# Patient Record
Sex: Female | Born: 1935 | Race: Black or African American | Hispanic: No | Smoking: Former smoker
Health system: Southern US, Community
[De-identification: ages and names within clinical notes are randomized; demographics above are authoritative.]

## PROBLEM LIST (undated history)

## (undated) DIAGNOSIS — E119 Type 2 diabetes mellitus without complications: Secondary | ICD-10-CM

## (undated) DIAGNOSIS — E7800 Pure hypercholesterolemia, unspecified: Secondary | ICD-10-CM

## (undated) DIAGNOSIS — I10 Essential (primary) hypertension: Secondary | ICD-10-CM

## (undated) DIAGNOSIS — M1990 Unspecified osteoarthritis, unspecified site: Secondary | ICD-10-CM

## (undated) DIAGNOSIS — K469 Unspecified abdominal hernia without obstruction or gangrene: Secondary | ICD-10-CM

## (undated) DIAGNOSIS — I509 Heart failure, unspecified: Secondary | ICD-10-CM

## (undated) DIAGNOSIS — I4891 Unspecified atrial fibrillation: Secondary | ICD-10-CM

## (undated) DIAGNOSIS — I2510 Atherosclerotic heart disease of native coronary artery without angina pectoris: Secondary | ICD-10-CM

## (undated) DIAGNOSIS — K639 Disease of intestine, unspecified: Secondary | ICD-10-CM

## (undated) DIAGNOSIS — B0229 Other postherpetic nervous system involvement: Secondary | ICD-10-CM

## (undated) DIAGNOSIS — R920 Mammographic microcalcification found on diagnostic imaging of breast: Secondary | ICD-10-CM

## (undated) DIAGNOSIS — Z87891 Personal history of nicotine dependence: Secondary | ICD-10-CM

## (undated) DIAGNOSIS — Z1239 Encounter for other screening for malignant neoplasm of breast: Secondary | ICD-10-CM

## (undated) DIAGNOSIS — J449 Chronic obstructive pulmonary disease, unspecified: Secondary | ICD-10-CM

## (undated) DIAGNOSIS — J219 Acute bronchiolitis, unspecified: Secondary | ICD-10-CM

## (undated) DIAGNOSIS — E785 Hyperlipidemia, unspecified: Secondary | ICD-10-CM

## (undated) DIAGNOSIS — K2100 Gastro-esophageal reflux disease with esophagitis, without bleeding: Secondary | ICD-10-CM

## (undated) DIAGNOSIS — I272 Pulmonary hypertension, unspecified: Secondary | ICD-10-CM

## (undated) DIAGNOSIS — Z1211 Encounter for screening for malignant neoplasm of colon: Secondary | ICD-10-CM

## (undated) HISTORY — DX: Mammographic microcalcification found on diagnostic imaging of breast: R92.0

## (undated) HISTORY — DX: Disease of intestine, unspecified: K63.9

## (undated) HISTORY — DX: Heart failure, unspecified: I50.9

## (undated) HISTORY — DX: Type 2 diabetes mellitus without complications: E11.9

## (undated) HISTORY — DX: Personal history of nicotine dependence: Z87.891

## (undated) HISTORY — DX: Essential (primary) hypertension: I10

## (undated) HISTORY — DX: Chronic obstructive pulmonary disease, unspecified: J44.9

## (undated) HISTORY — PX: CARDIAC CATHETERIZATION: SHX172

## (undated) HISTORY — DX: Other postherpetic nervous system involvement: B02.29

## (undated) HISTORY — DX: Unspecified atrial fibrillation: I48.91

## (undated) HISTORY — DX: Atherosclerotic heart disease of native coronary artery without angina pectoris: I25.10

## (undated) HISTORY — DX: Gastro-esophageal reflux disease with esophagitis: K21.0

## (undated) HISTORY — DX: Unspecified abdominal hernia without obstruction or gangrene: K46.9

## (undated) HISTORY — DX: Unspecified osteoarthritis, unspecified site: M19.90

## (undated) HISTORY — DX: Hyperlipidemia, unspecified: E78.5

## (undated) HISTORY — DX: Gastro-esophageal reflux disease with esophagitis, without bleeding: K21.00

## (undated) HISTORY — PX: BREAST BIOPSY: SHX20

## (undated) HISTORY — DX: Acute bronchiolitis, unspecified: J21.9

## (undated) HISTORY — DX: Pure hypercholesterolemia, unspecified: E78.00

## (undated) HISTORY — DX: Encounter for other screening for malignant neoplasm of breast: Z12.39

## (undated) HISTORY — DX: Encounter for screening for malignant neoplasm of colon: Z12.11

---

## 1964-02-06 DIAGNOSIS — I10 Essential (primary) hypertension: Secondary | ICD-10-CM

## 1964-02-06 HISTORY — DX: Essential (primary) hypertension: I10

## 1966-02-05 DIAGNOSIS — K639 Disease of intestine, unspecified: Secondary | ICD-10-CM

## 1966-02-05 HISTORY — DX: Disease of intestine, unspecified: K63.9

## 1997-06-03 ENCOUNTER — Other Ambulatory Visit: Admission: RE | Admit: 1997-06-03 | Discharge: 1997-06-03 | Payer: Self-pay | Admitting: Obstetrics and Gynecology

## 1997-08-19 ENCOUNTER — Other Ambulatory Visit: Admission: RE | Admit: 1997-08-19 | Discharge: 1997-08-19 | Payer: Self-pay | Admitting: Obstetrics and Gynecology

## 1997-12-14 ENCOUNTER — Other Ambulatory Visit: Admission: RE | Admit: 1997-12-14 | Discharge: 1997-12-14 | Payer: Self-pay | Admitting: Obstetrics and Gynecology

## 1997-12-17 ENCOUNTER — Other Ambulatory Visit: Admission: RE | Admit: 1997-12-17 | Discharge: 1997-12-17 | Payer: Self-pay | Admitting: Obstetrics and Gynecology

## 1998-02-14 ENCOUNTER — Other Ambulatory Visit: Admission: RE | Admit: 1998-02-14 | Discharge: 1998-02-14 | Payer: Self-pay | Admitting: Obstetrics and Gynecology

## 1998-02-15 ENCOUNTER — Other Ambulatory Visit: Admission: RE | Admit: 1998-02-15 | Discharge: 1998-02-15 | Payer: Self-pay | Admitting: Obstetrics and Gynecology

## 1998-06-06 ENCOUNTER — Other Ambulatory Visit: Admission: RE | Admit: 1998-06-06 | Discharge: 1998-06-06 | Payer: Self-pay | Admitting: Obstetrics and Gynecology

## 1998-06-07 ENCOUNTER — Other Ambulatory Visit: Admission: RE | Admit: 1998-06-07 | Discharge: 1998-06-07 | Payer: Self-pay | Admitting: Obstetrics and Gynecology

## 1998-11-30 ENCOUNTER — Other Ambulatory Visit: Admission: RE | Admit: 1998-11-30 | Discharge: 1998-11-30 | Payer: Self-pay | Admitting: Obstetrics and Gynecology

## 1999-03-06 ENCOUNTER — Other Ambulatory Visit: Admission: RE | Admit: 1999-03-06 | Discharge: 1999-03-06 | Payer: Self-pay | Admitting: Obstetrics and Gynecology

## 1999-06-15 ENCOUNTER — Other Ambulatory Visit: Admission: RE | Admit: 1999-06-15 | Discharge: 1999-06-15 | Payer: Self-pay | Admitting: Obstetrics and Gynecology

## 2000-01-02 HISTORY — PX: CARDIAC CATHETERIZATION: SHX172

## 2000-06-17 ENCOUNTER — Other Ambulatory Visit: Admission: RE | Admit: 2000-06-17 | Discharge: 2000-06-17 | Payer: Self-pay | Admitting: Obstetrics and Gynecology

## 2001-06-23 ENCOUNTER — Other Ambulatory Visit: Admission: RE | Admit: 2001-06-23 | Discharge: 2001-06-23 | Payer: Self-pay | Admitting: Obstetrics and Gynecology

## 2003-02-06 HISTORY — PX: COLONOSCOPY: SHX174

## 2003-11-06 ENCOUNTER — Encounter: Payer: Self-pay | Admitting: Family Medicine

## 2003-12-07 ENCOUNTER — Encounter: Payer: Self-pay | Admitting: Family Medicine

## 2004-01-06 ENCOUNTER — Encounter: Payer: Self-pay | Admitting: Family Medicine

## 2004-01-27 ENCOUNTER — Ambulatory Visit: Payer: Self-pay | Admitting: Family Medicine

## 2004-02-06 ENCOUNTER — Encounter: Payer: Self-pay | Admitting: Family Medicine

## 2004-03-08 ENCOUNTER — Encounter: Payer: Self-pay | Admitting: Family Medicine

## 2004-04-05 ENCOUNTER — Encounter: Payer: Self-pay | Admitting: Family Medicine

## 2004-05-06 ENCOUNTER — Encounter: Payer: Self-pay | Admitting: Family Medicine

## 2004-06-05 ENCOUNTER — Encounter: Payer: Self-pay | Admitting: Family Medicine

## 2004-07-06 ENCOUNTER — Encounter: Payer: Self-pay | Admitting: Family Medicine

## 2004-08-02 ENCOUNTER — Ambulatory Visit: Payer: Self-pay | Admitting: Family Medicine

## 2004-08-04 ENCOUNTER — Ambulatory Visit: Payer: Self-pay | Admitting: Ophthalmology

## 2004-08-07 ENCOUNTER — Encounter: Payer: Self-pay | Admitting: Family Medicine

## 2004-08-14 ENCOUNTER — Ambulatory Visit: Payer: Self-pay | Admitting: Ophthalmology

## 2004-08-16 ENCOUNTER — Ambulatory Visit: Payer: Self-pay | Admitting: Family Medicine

## 2004-09-05 ENCOUNTER — Encounter: Payer: Self-pay | Admitting: Family Medicine

## 2004-09-15 ENCOUNTER — Ambulatory Visit: Payer: Self-pay | Admitting: Family Medicine

## 2004-09-21 ENCOUNTER — Ambulatory Visit: Payer: Self-pay | Admitting: Family Medicine

## 2004-10-06 ENCOUNTER — Encounter: Payer: Self-pay | Admitting: Family Medicine

## 2004-11-05 ENCOUNTER — Encounter: Payer: Self-pay | Admitting: Family Medicine

## 2004-12-06 ENCOUNTER — Encounter: Payer: Self-pay | Admitting: Family Medicine

## 2005-01-05 ENCOUNTER — Encounter: Payer: Self-pay | Admitting: Family Medicine

## 2005-02-06 ENCOUNTER — Encounter: Payer: Self-pay | Admitting: Family Medicine

## 2005-02-08 ENCOUNTER — Ambulatory Visit: Payer: Self-pay | Admitting: Family Medicine

## 2005-03-08 ENCOUNTER — Encounter: Payer: Self-pay | Admitting: Family Medicine

## 2005-04-05 ENCOUNTER — Encounter: Payer: Self-pay | Admitting: Family Medicine

## 2005-05-06 ENCOUNTER — Encounter: Payer: Self-pay | Admitting: Family Medicine

## 2005-06-05 ENCOUNTER — Encounter: Payer: Self-pay | Admitting: Family Medicine

## 2005-07-06 ENCOUNTER — Encounter: Payer: Self-pay | Admitting: Family Medicine

## 2005-08-07 ENCOUNTER — Encounter: Payer: Self-pay | Admitting: Family Medicine

## 2005-08-21 ENCOUNTER — Ambulatory Visit: Payer: Self-pay | Admitting: Family Medicine

## 2005-09-04 ENCOUNTER — Ambulatory Visit: Payer: Self-pay | Admitting: Family Medicine

## 2005-09-05 ENCOUNTER — Encounter: Payer: Self-pay | Admitting: Family Medicine

## 2005-10-06 ENCOUNTER — Encounter: Payer: Self-pay | Admitting: Family Medicine

## 2005-11-05 ENCOUNTER — Encounter: Payer: Self-pay | Admitting: Family Medicine

## 2005-12-06 ENCOUNTER — Encounter: Payer: Self-pay | Admitting: Family Medicine

## 2006-01-05 ENCOUNTER — Encounter: Payer: Self-pay | Admitting: Family Medicine

## 2006-02-05 ENCOUNTER — Encounter: Payer: Self-pay | Admitting: Family Medicine

## 2006-03-08 ENCOUNTER — Encounter: Payer: Self-pay | Admitting: Family Medicine

## 2006-03-13 ENCOUNTER — Ambulatory Visit: Payer: Self-pay | Admitting: Family Medicine

## 2006-04-09 ENCOUNTER — Encounter: Payer: Self-pay | Admitting: Family Medicine

## 2006-04-17 ENCOUNTER — Ambulatory Visit: Payer: Self-pay | Admitting: Family Medicine

## 2006-05-07 ENCOUNTER — Encounter: Payer: Self-pay | Admitting: Family Medicine

## 2006-06-06 ENCOUNTER — Encounter: Payer: Self-pay | Admitting: Family Medicine

## 2006-07-07 ENCOUNTER — Encounter: Payer: Self-pay | Admitting: Family Medicine

## 2006-08-06 ENCOUNTER — Encounter: Payer: Self-pay | Admitting: Family Medicine

## 2006-08-30 ENCOUNTER — Ambulatory Visit: Payer: Self-pay | Admitting: Family Medicine

## 2006-09-12 ENCOUNTER — Encounter: Payer: Self-pay | Admitting: Family Medicine

## 2006-09-17 ENCOUNTER — Ambulatory Visit: Payer: Self-pay | Admitting: Family Medicine

## 2006-10-07 ENCOUNTER — Encounter: Payer: Self-pay | Admitting: Family Medicine

## 2007-03-05 ENCOUNTER — Ambulatory Visit: Payer: Self-pay | Admitting: Family Medicine

## 2007-09-09 ENCOUNTER — Ambulatory Visit: Payer: Self-pay | Admitting: Family Medicine

## 2007-09-14 ENCOUNTER — Other Ambulatory Visit: Payer: Self-pay

## 2007-09-14 ENCOUNTER — Emergency Department: Payer: Self-pay | Admitting: Unknown Physician Specialty

## 2007-09-21 ENCOUNTER — Emergency Department: Payer: Self-pay | Admitting: Emergency Medicine

## 2007-09-25 ENCOUNTER — Ambulatory Visit: Payer: Self-pay | Admitting: Family Medicine

## 2007-09-26 ENCOUNTER — Ambulatory Visit: Payer: Self-pay | Admitting: Family Medicine

## 2007-10-07 ENCOUNTER — Ambulatory Visit: Payer: Self-pay | Admitting: Family Medicine

## 2007-10-29 ENCOUNTER — Ambulatory Visit: Payer: Self-pay | Admitting: Family Medicine

## 2007-11-06 ENCOUNTER — Ambulatory Visit: Payer: Self-pay | Admitting: Family Medicine

## 2007-12-07 ENCOUNTER — Ambulatory Visit: Payer: Self-pay | Admitting: Family Medicine

## 2008-01-06 ENCOUNTER — Ambulatory Visit: Payer: Self-pay | Admitting: Family Medicine

## 2008-03-18 ENCOUNTER — Ambulatory Visit: Payer: Self-pay | Admitting: Family Medicine

## 2008-05-06 ENCOUNTER — Ambulatory Visit: Payer: Self-pay | Admitting: Family Medicine

## 2008-09-28 ENCOUNTER — Ambulatory Visit: Payer: Self-pay | Admitting: Family Medicine

## 2009-02-05 DIAGNOSIS — I2510 Atherosclerotic heart disease of native coronary artery without angina pectoris: Secondary | ICD-10-CM

## 2009-02-05 DIAGNOSIS — K469 Unspecified abdominal hernia without obstruction or gangrene: Secondary | ICD-10-CM

## 2009-02-05 HISTORY — PX: HERNIA REPAIR: SHX51

## 2009-02-05 HISTORY — DX: Atherosclerotic heart disease of native coronary artery without angina pectoris: I25.10

## 2009-02-05 HISTORY — PX: CORONARY ANGIOPLASTY WITH STENT PLACEMENT: SHX49

## 2009-02-05 HISTORY — DX: Unspecified abdominal hernia without obstruction or gangrene: K46.9

## 2009-03-03 ENCOUNTER — Ambulatory Visit: Payer: Self-pay | Admitting: Family Medicine

## 2009-04-26 ENCOUNTER — Ambulatory Visit: Payer: Self-pay | Admitting: Specialist

## 2009-09-29 ENCOUNTER — Ambulatory Visit: Payer: Self-pay | Admitting: Family Medicine

## 2010-10-17 ENCOUNTER — Ambulatory Visit: Payer: Self-pay | Admitting: Family Medicine

## 2010-12-29 ENCOUNTER — Encounter: Payer: Self-pay | Admitting: Cardiovascular Disease

## 2010-12-29 ENCOUNTER — Inpatient Hospital Stay: Payer: Self-pay | Admitting: Internal Medicine

## 2010-12-30 ENCOUNTER — Encounter: Payer: Self-pay | Admitting: Cardiovascular Disease

## 2011-01-11 ENCOUNTER — Encounter: Payer: Self-pay | Admitting: Cardiovascular Disease

## 2011-01-11 ENCOUNTER — Ambulatory Visit: Payer: Self-pay | Admitting: Internal Medicine

## 2011-02-06 DIAGNOSIS — R920 Mammographic microcalcification found on diagnostic imaging of breast: Secondary | ICD-10-CM

## 2011-02-06 HISTORY — DX: Mammographic microcalcification found on diagnostic imaging of breast: R92.0

## 2011-02-06 LAB — HM PAP SMEAR: HM PAP: NORMAL

## 2011-02-09 ENCOUNTER — Ambulatory Visit: Payer: Self-pay | Admitting: Family Medicine

## 2011-03-09 ENCOUNTER — Ambulatory Visit: Payer: Self-pay | Admitting: Family Medicine

## 2011-03-27 ENCOUNTER — Encounter: Payer: Self-pay | Admitting: *Deleted

## 2011-03-27 ENCOUNTER — Ambulatory Visit (INDEPENDENT_AMBULATORY_CARE_PROVIDER_SITE_OTHER): Payer: Medicare Other | Admitting: Cardiovascular Disease

## 2011-03-27 ENCOUNTER — Ambulatory Visit: Payer: Self-pay | Admitting: Cardiovascular Disease

## 2011-03-27 ENCOUNTER — Encounter: Payer: Self-pay | Admitting: Cardiovascular Disease

## 2011-03-27 VITALS — BP 200/100 | HR 64 | Ht 60.0 in | Wt 103.0 lb

## 2011-03-27 DIAGNOSIS — I2510 Atherosclerotic heart disease of native coronary artery without angina pectoris: Secondary | ICD-10-CM

## 2011-03-27 DIAGNOSIS — J40 Bronchitis, not specified as acute or chronic: Secondary | ICD-10-CM | POA: Insufficient documentation

## 2011-03-27 DIAGNOSIS — I10 Essential (primary) hypertension: Secondary | ICD-10-CM

## 2011-03-27 DIAGNOSIS — I5020 Unspecified systolic (congestive) heart failure: Secondary | ICD-10-CM

## 2011-03-27 DIAGNOSIS — J449 Chronic obstructive pulmonary disease, unspecified: Secondary | ICD-10-CM

## 2011-03-27 DIAGNOSIS — Z87891 Personal history of nicotine dependence: Secondary | ICD-10-CM

## 2011-03-27 MED ORDER — LOSARTAN POTASSIUM 100 MG PO TABS: 100.0000 mg | ORAL_TABLET | Freq: Every day | ORAL | Status: DC

## 2011-03-27 MED ORDER — AMLODIPINE BESYLATE 10 MG PO TABS: 10.0000 mg | ORAL_TABLET | Freq: Every day | ORAL | Status: DC

## 2011-03-27 NOTE — Assessment & Plan Note (Signed)
She has long history of smoking, likely mild to moderate COPD, chronic cough from smoking.

## 2011-03-27 NOTE — Progress Notes (Signed)
Patient ID: Allison Francis, female    DOB: 1935-02-10, 76 y.o.   MRN: [id number]  HPI Comments: Ms. Marmo is a very pleasant 76 year old woman with a long history of smoking from age 40 q.h.s. The stopped 15 years ago, Coronary artery disease with stenting in October 2011, diabetes history of hypertension who presented to [facility] at the end of November with acute bronchitis, admitted to the hospital for further management. Echocardiogram showed moderately reduced ejection fraction estimated at 25-35%, moderately dilated left atrium, moderate TR, right ventricular systolic pressure mildly elevated.  She had a pharmacologic Myoview that showed no significant ischemia. Interestingly ejection fraction was normal estimated greater than 55%  Her Lasix and amlodipine were discontinued while in the hospital. She has not been checking her numbers at home. She is surprised that her blood pressure numbers are elevated today. She otherwise feels well and reports her bronchitis is improving. She does have a chronic cough She is very concerned about a diagnosis of COPD and CHF. She denies any significant abdominal bloating, no lower extremity edema, no PND or orthopnea  Blood work in the hospital shows total cholesterol 156, LDL 92, HDL 52  EKG shows normal sinus rhythm with rate 57 beats per minute with nonspecific ST abnormality in leads V5, V6, inferior leads   Outpatient Encounter Prescriptions as of 03/27/2011  Medication Sig Dispense Refill  . allopurinol (ZYLOPRIM) 100 MG tablet Take 100 mg by mouth daily.      Marland Kitchen aspirin EC 81 MG tablet Take 81 mg by mouth daily.      . metoprolol succinate (TOPROL-XL) 50 MG 24 hr tablet Take 50 mg by mouth daily. Take with or immediately following a meal.      . omeprazole (PRILOSEC) 40 MG capsule Take 40 mg by mouth daily.      Marland Kitchen  losartan (COZAAR) 50 MG tablet Take 50 mg by mouth daily.        Review of Systems  Constitutional: Negative.   HENT: Negative.     Eyes: Negative.   Respiratory: Positive for cough.   Cardiovascular: Negative.   Gastrointestinal: Negative.   Musculoskeletal: Negative.   Skin: Negative.   Neurological: Negative.   Hematological: Negative.   Psychiatric/Behavioral: Negative.   All other systems reviewed and are negative.    BP 200/100  Pulse 64  Ht 5' (1.524 m)  Wt 103 lb (46.72 kg)  BMI 20.12 kg/m2  Physical Exam  Nursing note and vitals reviewed. Constitutional: She is oriented to person, place, and time. She appears well-developed and well-nourished.  HENT:  Head: Normocephalic.  Nose: Nose normal.  Mouth/Throat: Oropharynx is clear and moist.  Eyes: Conjunctivae are normal. Pupils are equal, round, and reactive to light.  Neck: Normal range of motion. Neck supple. No JVD present.  Cardiovascular: Normal rate, regular rhythm, S1 normal, S2 normal, normal heart sounds and intact distal pulses.  Exam reveals no gallop and no friction rub.   No murmur heard. Pulmonary/Chest: Effort normal and breath sounds normal. No respiratory distress. She has no wheezes. She has no rales. She exhibits no tenderness.  Abdominal: Soft. Bowel sounds are normal. She exhibits no distension. There is no tenderness.  Musculoskeletal: Normal range of motion. She exhibits no edema and no tenderness.  Lymphadenopathy:    She has no cervical adenopathy.  Neurological: She is alert and oriented to person, place, and time. Coordination normal.  Skin: Skin is warm and dry. No rash noted. No erythema.  Psychiatric: She  has a normal mood and affect. Her behavior is normal. Judgment and thought content normal.         Assessment and Plan

## 2011-03-27 NOTE — Patient Instructions (Addendum)
  Please start amlodipine 10 mg a day (two of the the 5 mg tabs you have at home)  Increase the losartan to 100 mg daily (two of the 50 mg losartan pills) starting tomorrow if your blood pressure stays high. Goal blood pressure is less than 140 on the top.   Please call us if you have new issues that need to be addressed before your next appt.  Your physician wants you to follow-up in: 1 months.  You will receive a reminder letter in the mail two months in advance. If you don't receive a letter, please call our office to schedule the follow-up appointment.

## 2011-03-27 NOTE — Assessment & Plan Note (Signed)
Echocardiogram in November 2012 showing ejection fraction 25-35%. Interestingly stress test showed normal LV function. I will review the previous echocardiogram. She appears relatively asymptomatic at this time. At some point, repeat echocardiogram could be done to reassess her LV function. Uncertain if the systolic dysfunction was secondary to concurrent upper respiratory tract infection/bronchitis.  Unable to exclude a hypertensive cardiomyopathy.

## 2011-03-27 NOTE — Assessment & Plan Note (Signed)
History of coronary artery disease and stenting. We will try to obtain the records of this for our system. Currently with no symptoms of chest pain. Stress test showing no significant ischemia.

## 2011-03-27 NOTE — Assessment & Plan Note (Addendum)
Blood pressure is very elevated today. We have suggested she restart her amlodipine 10 mg daily. If her blood pressure continues to be elevated over the next several days, she could increase her losartan to 100 mg daily. Have asked her to closely monitor her blood pressure at home and call our office with some numbers. We will have her followup in one month for her blood pressure management.

## 2011-09-10 ENCOUNTER — Ambulatory Visit: Payer: Self-pay | Admitting: Unknown Physician Specialty

## 2011-09-10 LAB — CREATININE, SERUM
EGFR (African American): 44 — ABNORMAL LOW
EGFR (Non-African Amer.): 38 — ABNORMAL LOW

## 2011-10-18 ENCOUNTER — Ambulatory Visit: Payer: Self-pay | Admitting: Family Medicine

## 2011-10-24 ENCOUNTER — Ambulatory Visit: Payer: Self-pay | Admitting: Family Medicine

## 2012-02-06 DIAGNOSIS — J219 Acute bronchiolitis, unspecified: Secondary | ICD-10-CM

## 2012-02-06 HISTORY — DX: Acute bronchiolitis, unspecified: J21.9

## 2012-03-06 ENCOUNTER — Encounter: Payer: Self-pay | Admitting: *Deleted

## 2012-03-09 LAB — COMPREHENSIVE METABOLIC PANEL
Albumin: 3.3 g/dL — ABNORMAL LOW (ref 3.4–5.0)
Alkaline Phosphatase: 141 U/L — ABNORMAL HIGH (ref 50–136)
Bilirubin,Total: 0.5 mg/dL (ref 0.2–1.0)
Bilirubin,Total: 0.7 mg/dL (ref 0.2–1.0)
Calcium, Total: 8 mg/dL — ABNORMAL LOW (ref 8.5–10.1)
Calcium, Total: 8.5 mg/dL (ref 8.5–10.1)
Chloride: 103 mmol/L (ref 98–107)
Co2: 26 mmol/L (ref 21–32)
Co2: 27 mmol/L (ref 21–32)
EGFR (African American): >60
EGFR (African American): >60
EGFR (Non-African Amer.): >60
Glucose: 301 mg/dL — ABNORMAL HIGH (ref 65–99)
Potassium: 4.2 mmol/L (ref 3.5–5.1)
SGOT(AST): 63 U/L — ABNORMAL HIGH (ref 15–37)
SGOT(AST): 50 U/L — ABNORMAL HIGH (ref 15–37)
SGPT (ALT): 103 U/L — ABNORMAL HIGH (ref 12–78)
SGPT (ALT): 101 U/L — ABNORMAL HIGH (ref 12–78)
Sodium: 137 mmol/L (ref 136–145)
Sodium: 140 mmol/L (ref 136–145)

## 2012-03-09 LAB — URINALYSIS, COMPLETE
Bilirubin,UR: NEGATIVE
Glucose,UR: 150 mg/dL (ref 0–75)
Leukocyte Esterase: NEGATIVE
Ph: 5 (ref 4.5–8.0)
Protein: NEGATIVE
Specific Gravity: 1.012 (ref 1.003–1.030)

## 2012-03-09 LAB — CBC WITH DIFFERENTIAL/PLATELET
Basophil #: 0 10*3/uL (ref 0.0–0.1)
Eosinophil #: 0 10*3/uL (ref 0.0–0.7)
Eosinophil %: 0 %
HCT: 39 % (ref 35.0–47.0)
HGB: 12.4 g/dL (ref 12.0–16.0)
Lymphocyte #: 0.4 10*3/uL — ABNORMAL LOW (ref 1.0–3.6)
Lymphocyte %: 2.9 %
MCH: 27.3 pg (ref 26.0–34.0)
MCHC: 31.8 g/dL — ABNORMAL LOW (ref 32.0–36.0)
Monocyte #: 0.1 x10 3/mm — ABNORMAL LOW (ref 0.2–0.9)
Monocyte %: 1 %
Neutrophil #: 11.6 10*3/uL — ABNORMAL HIGH (ref 1.4–6.5)
RBC: 4.56 10*6/uL (ref 3.80–5.20)
RDW: 15.5 % — ABNORMAL HIGH (ref 11.5–14.5)
WBC: 12.1 10*3/uL — ABNORMAL HIGH (ref 3.6–11.0)

## 2012-03-09 LAB — CBC
HCT: 36.8 % (ref 35.0–47.0)
HGB: 11.8 g/dL — ABNORMAL LOW (ref 12.0–16.0)
MCH: 27.4 pg (ref 26.0–34.0)
MCHC: 32.2 g/dL (ref 32.0–36.0)
MCV: 85 fL (ref 80–100)
RBC: 4.32 10*6/uL (ref 3.80–5.20)
WBC: 16.7 10*3/uL — ABNORMAL HIGH (ref 3.6–11.0)

## 2012-03-09 LAB — CK TOTAL AND CKMB (NOT AT ARMC)
CK, Total: 32 U/L (ref 21–215)
CK, Total: 34 U/L (ref 21–215)
CK-MB: 1.1 ng/mL (ref 0.5–3.6)
CK-MB: 2.3 ng/mL (ref 0.5–3.6)

## 2012-03-09 LAB — TROPONIN I: Troponin-I: 0.1 ng/mL — ABNORMAL HIGH

## 2012-03-09 LAB — PRO B NATRIURETIC PEPTIDE: B-Type Natriuretic Peptide: 5687 pg/mL — ABNORMAL HIGH (ref 0–125)

## 2012-03-10 ENCOUNTER — Inpatient Hospital Stay: Payer: Self-pay | Admitting: Specialist

## 2012-03-14 LAB — CULTURE, BLOOD (SINGLE)

## 2012-03-18 ENCOUNTER — Ambulatory Visit: Payer: Self-pay | Admitting: Unknown Physician Specialty

## 2012-03-18 LAB — CREATININE, SERUM
Creatinine: 1.16 mg/dL (ref 0.60–1.30)
EGFR (African American): 54 — ABNORMAL LOW
EGFR (Non-African Amer.): 46 — ABNORMAL LOW

## 2012-04-15 ENCOUNTER — Encounter: Payer: Self-pay | Admitting: *Deleted

## 2012-04-17 ENCOUNTER — Ambulatory Visit: Payer: Self-pay | Admitting: General Surgery

## 2012-04-24 ENCOUNTER — Ambulatory Visit (INDEPENDENT_AMBULATORY_CARE_PROVIDER_SITE_OTHER): Payer: Medicare Other | Admitting: General Surgery

## 2012-04-24 ENCOUNTER — Other Ambulatory Visit: Payer: Self-pay

## 2012-04-24 ENCOUNTER — Encounter: Payer: Self-pay | Admitting: General Surgery

## 2012-04-24 VITALS — BP 98/62 | HR 76 | Resp 14 | Ht 60.0 in | Wt 100.0 lb

## 2012-04-24 DIAGNOSIS — R920 Mammographic microcalcification found on diagnostic imaging of breast: Secondary | ICD-10-CM

## 2012-04-24 DIAGNOSIS — N63 Unspecified lump in unspecified breast: Secondary | ICD-10-CM

## 2012-04-24 NOTE — Patient Instructions (Addendum)
Patient has been scheduled for a right breast stereotactic biopsy at [facility] for 05-19-12 at 4 pm (arrive 3:30 pm). She is aware of date, time, and instructions.

## 2012-04-24 NOTE — Progress Notes (Signed)
Patient ID: Allison Francis, female   DOB: February 25, 1935, 77 y.o.   MRN: [id number]  Chief Complaint  Patient presents with  . Follow-up    mammo    HPI Allison Francis is a 77 y.o. female following up from a mammogram done at [facility] 3/13/144. Patient reports no new breast problems. HPI  Past Medical History  Diagnosis Date  . Type II or unspecified type diabetes mellitus without mention of complication, not stated as uncontrolled   . Chronic airway obstruction, not elsewhere classified   . Congestive heart failure, unspecified   . Pure hypercholesterolemia   . Essential hypertension, benign   . Gouty arthropathy   . Coronary atherosclerosis of unspecified type of vessel, native or graft 2011  . Arthritis   . Hernia 2011  . Hypertension 1966  . Personal history of tobacco use, presenting hazards to health   . Breast screening, unspecified   . Mammographic microcalcification 2013  . Special screening for malignant neoplasms, colon   . Bowel trouble 1968  . Bronchiolitis 2014    Past Surgical History  Procedure Laterality Date  . Cardiac catheterization  01-02-00    cardiac stent, RCA  . Hernia repair  2011  . Coronary angioplasty with stent placement  2011  . Colonoscopy  2005    Family History  Problem Relation Age of Onset  . Multiple myeloma Mother   . Heart attack Mother   . Breast cancer Mother   . Hypertension      fx  . Diabetes Mother     Social History History  Substance Use Topics  . Smoking status: Former Smoker -- 1.00 packs/day for 35 years    Types: Cigarettes    Quit date: 02/06/1996  . Smokeless tobacco: Never Used  . Alcohol Use: 3.0 oz/week    6 drink(s) per week    Allergies  Allergen Reactions  . Metformin Hcl Diarrhea  . Penicillins Hives    Current Outpatient Prescriptions  Medication Sig Dispense Refill  . allopurinol (ZYLOPRIM) 100 MG tablet Take 100 mg by mouth daily.      Marland Kitchen amLODipine (NORVASC) 10 MG tablet Take 1 tablet (10 mg total)  by mouth daily.  30 tablet  11  . amLODipine (NORVASC) 10 MG tablet Take 5 mg by mouth daily.      Marland Kitchen aspirin EC 81 MG tablet Take 81 mg by mouth daily.      . Fluticasone-Salmeterol (ADVAIR DISKUS) 250-50 MCG/DOSE AEPB Inhale 1 puff into the lungs every 12 (twelve) hours.      . lidocaine (LIDODERM) 5 % Apply 1 patch topically daily. Remove & Discard patch within 12 hours or as directed by MD      . losartan (COZAAR) 100 MG tablet Take 1 tablet (100 mg total) by mouth daily.  90 tablet  4  . metoprolol succinate (TOPROL-XL) 50 MG 24 hr tablet Take 50 mg by mouth daily. Take with or immediately following a meal.      . omeprazole (PRILOSEC) 40 MG capsule Take 40 mg by mouth daily.      . propafenone (RYTHMOL SR) 225 MG 12 hr capsule        No current facility-administered medications for this visit.    Review of Systems Review of Systems  Constitutional: Negative.   Respiratory: Negative.   Cardiovascular: Negative.     Blood pressure 98/62, pulse 76, resp. rate 14, height 5' (1.524 m), weight 100 lb (45.36 kg), last menstrual period 02/05/1989.  Physical Exam Physical Exam  Nursing note and vitals reviewed. Constitutional: She appears well-developed and well-nourished.  Neck: Normal range of motion.  Cardiovascular: Normal rate, regular rhythm and normal heart sounds.   Pulmonary/Chest: Breath sounds normal. Right breast exhibits no inverted nipple, no mass, no nipple discharge, no skin change and no tenderness. Left breast exhibits no inverted nipple, no mass, no nipple discharge, no skin change and no tenderness.  Lymphadenopathy:    She has axillary adenopathy.  There is a palpable lymph node in the lower aspect of the right axilla.  Data Reviewed Ultrasound examination of the right axilla confirms a mildly enlarged 0.5 x 1.06 x 1.18 lymph node with normal echo architecture. This likely corresponds a lesion identified on a recent mammogram. The mammogram dated April 17, 2012  showed a subtle cluster of calcifications that are more prominent than on her last exam.  BIRAD 4. Assessment    Microcalcifications of the right breast.  Recent pneumonia requiring hospitalization with mild, persistent cough     Plan    Considering the increasing prominence of the microcalcifications excision as been recommended once her pulmonary symptoms have abated. Arrangements are in place for a right breast stereotactic biopsy on May 19, 2012.        Diamond Nickel 04/24/2012, 3:20 PM

## 2012-05-01 ENCOUNTER — Ambulatory Visit: Payer: Self-pay | Admitting: Unknown Physician Specialty

## 2012-05-19 ENCOUNTER — Ambulatory Visit: Payer: Medicare Other | Admitting: General Surgery

## 2012-05-19 ENCOUNTER — Ambulatory Visit: Payer: Self-pay | Admitting: General Surgery

## 2012-05-19 DIAGNOSIS — R920 Mammographic microcalcification found on diagnostic imaging of breast: Secondary | ICD-10-CM

## 2012-05-20 ENCOUNTER — Telehealth: Payer: Self-pay | Admitting: *Deleted

## 2012-05-20 LAB — PATHOLOGY REPORT

## 2012-05-20 NOTE — Telephone Encounter (Signed)
Notified patient of most recent breast biopsy benign per Dr. Lemar Livings, patient pleased.  Reminded of nurse appointment.

## 2012-05-21 ENCOUNTER — Encounter: Payer: Self-pay | Admitting: General Surgery

## 2012-05-22 ENCOUNTER — Ambulatory Visit: Payer: Self-pay | Admitting: Unknown Physician Specialty

## 2012-05-27 ENCOUNTER — Ambulatory Visit (INDEPENDENT_AMBULATORY_CARE_PROVIDER_SITE_OTHER): Payer: Medicare Other | Admitting: *Deleted

## 2012-05-27 DIAGNOSIS — N63 Unspecified lump in unspecified breast: Secondary | ICD-10-CM

## 2012-05-27 NOTE — Patient Instructions (Addendum)
Resume activities as tolerated. The patient is aware to call back for any questions or concerns. 

## 2012-05-27 NOTE — Progress Notes (Signed)
Patient here today for follow up post right breast biopsy.  Dressing off, steristrip in place and aware it may come off in one week.  Minimal external bruising noted, however she does have a "knot" that if felt. Aware she is to use a heating pad for comfort twice a day.  Aware of pathology.   Follow up in 6 months

## 2012-05-28 NOTE — Progress Notes (Signed)
Quick Note:  The right breast biopsy completed for microcalcification showed ductal hyperplasia, sclerosing adenosis and microcalcifications as well as an apical cyst and benign encapsulated lymphoid tissue. No evidence of atypia or malignancy.  The patient has been notified of the results.  Arrangements were made for a post biopsy mammogram in 6 months with office visit follow.  CC: PCP ______

## 2012-06-12 ENCOUNTER — Ambulatory Visit: Payer: Self-pay | Admitting: Family Medicine

## 2012-06-12 LAB — HM DEXA SCAN

## 2012-06-17 ENCOUNTER — Encounter: Payer: Self-pay | Admitting: General Surgery

## 2012-07-10 ENCOUNTER — Encounter: Payer: Self-pay | Admitting: Specialist

## 2012-08-05 ENCOUNTER — Encounter: Payer: Self-pay | Admitting: Specialist

## 2012-08-13 DIAGNOSIS — I429 Cardiomyopathy, unspecified: Secondary | ICD-10-CM | POA: Insufficient documentation

## 2012-08-13 DIAGNOSIS — M109 Gout, unspecified: Secondary | ICD-10-CM | POA: Insufficient documentation

## 2012-08-13 DIAGNOSIS — I4891 Unspecified atrial fibrillation: Secondary | ICD-10-CM | POA: Insufficient documentation

## 2012-08-14 DIAGNOSIS — K219 Gastro-esophageal reflux disease without esophagitis: Secondary | ICD-10-CM | POA: Insufficient documentation

## 2012-08-14 DIAGNOSIS — E119 Type 2 diabetes mellitus without complications: Secondary | ICD-10-CM | POA: Insufficient documentation

## 2012-09-05 ENCOUNTER — Encounter: Payer: Self-pay | Admitting: Specialist

## 2012-10-21 ENCOUNTER — Ambulatory Visit: Payer: Self-pay | Admitting: General Surgery

## 2012-10-23 ENCOUNTER — Encounter: Payer: Self-pay | Admitting: General Surgery

## 2012-10-29 ENCOUNTER — Ambulatory Visit: Payer: Medicare Other | Admitting: General Surgery

## 2012-11-04 ENCOUNTER — Encounter: Payer: Self-pay | Admitting: General Surgery

## 2012-11-04 ENCOUNTER — Ambulatory Visit (INDEPENDENT_AMBULATORY_CARE_PROVIDER_SITE_OTHER): Payer: Medicare Other | Admitting: General Surgery

## 2012-11-04 VITALS — BP 140/70 | HR 78 | Resp 16 | Ht 60.0 in | Wt 102.0 lb

## 2012-11-04 DIAGNOSIS — N63 Unspecified lump in unspecified breast: Secondary | ICD-10-CM

## 2012-11-04 NOTE — Patient Instructions (Signed)
Patient to return as needed. 

## 2012-11-04 NOTE — Progress Notes (Signed)
Patient ID: Allison Francis, female   DOB: 1935/10/25, 77 y.o.   MRN: [id number]  Chief Complaint  Patient presents with  . Follow-up    mammogram    HPI Allison Francis is a 77 y.o. female who presents for a breast evaluation. The most recent mammogram was done on 10/21/12 with a birad category 2. Patient does perform regular self breast checks and gets regular mammograms done.  The patient denies any new problems with the breasts at this time.    HPI  Past Medical History  Diagnosis Date  . Type II or unspecified type diabetes mellitus without mention of complication, not stated as uncontrolled   . Chronic airway obstruction, not elsewhere classified   . Congestive heart failure, unspecified   . Pure hypercholesterolemia   . Essential hypertension, benign   . Gouty arthropathy   . Coronary atherosclerosis of unspecified type of vessel, native or graft 2011  . Arthritis   . Hernia 2011  . Hypertension 1966  . Personal history of tobacco use, presenting hazards to health   . Breast screening, unspecified   . Mammographic microcalcification 2013  . Special screening for malignant neoplasms, colon   . Bowel trouble 1968  . Bronchiolitis 2014    Past Surgical History  Procedure Laterality Date  . Cardiac catheterization  01-02-00    cardiac stent, RCA  . Hernia repair  2011  . Coronary angioplasty with stent placement  2011  . Colonoscopy  2005    Family History  Problem Relation Age of Onset  . Multiple myeloma Mother   . Heart attack Mother   . Breast cancer Mother   . Hypertension      fx  . Diabetes Mother     Social History History  Substance Use Topics  . Smoking status: Former Smoker -- 1.00 packs/day for 35 years    Types: Cigarettes    Quit date: 02/06/1996  . Smokeless tobacco: Never Used  . Alcohol Use: 3.0 oz/week    6 drink(s) per week    Allergies  Allergen Reactions  . Metformin Hcl Diarrhea  . Penicillins Hives    Current Outpatient  Prescriptions  Medication Sig Dispense Refill  . allopurinol (ZYLOPRIM) 100 MG tablet Take 100 mg by mouth daily.      Marland Kitchen amLODipine (NORVASC) 5 MG tablet Take 5 mg by mouth daily.      Marland Kitchen aspirin EC 81 MG tablet Take 81 mg by mouth daily.      . Fluticasone-Salmeterol (ADVAIR DISKUS) 250-50 MCG/DOSE AEPB Inhale 1 puff into the lungs every 12 (twelve) hours.      . lidocaine (LIDODERM) 5 % Apply 1 patch topically daily. Remove & Discard patch within 12 hours or as directed by MD      . losartan (COZAAR) 100 MG tablet Take 1 tablet (100 mg total) by mouth daily.  90 tablet  4  . metoprolol succinate (TOPROL-XL) 50 MG 24 hr tablet Take 50 mg by mouth daily. Take with or immediately following a meal.      . omeprazole (PRILOSEC) 40 MG capsule Take 40 mg by mouth daily.      . propafenone (RYTHMOL SR) 225 MG 12 hr capsule        No current facility-administered medications for this visit.    Review of Systems Review of Systems  Constitutional: Negative.   Respiratory: Negative.   Cardiovascular: Negative.     Blood pressure 140/70, pulse 78, resp. rate 16, height 5' (1.524  m), weight 102 lb (46.267 kg), last menstrual period 02/05/1989.  Physical Exam Physical Exam  Constitutional: She is oriented to person, place, and time. She appears well-developed and well-nourished.  Neck: No thyromegaly present.  Cardiovascular: Normal rate, regular rhythm and normal heart sounds.   No murmur heard. Pulmonary/Chest: Effort normal and breath sounds normal. Right breast exhibits no inverted nipple, no mass, no nipple discharge, no skin change and no tenderness. Left breast exhibits no inverted nipple, no mass, no nipple discharge, no skin change and no tenderness.  Lymphadenopathy:    She has no cervical adenopathy.    She has no axillary adenopathy.  Neurological: She is alert and oriented to person, place, and time.  Skin: Skin is warm and dry.    Data Reviewed Bilateral mammograms dated  10/21/2012 were reviewed. Left breast is unremarkable. Small residual postsurgical hematoma in the upper outer aspect of the right breast with associated stereotactic clip. Reduction a number of clustered microcalcifications. BI-RAD-2.   The right breast biopsy completed April 2014 for microcalcification showed ductal hyperplasia, sclerosing adenosis and microcalcifications as well as an apical cyst and benign encapsulated lymphoid tissue. No evidence of atypia or malignancy.   Assessment    Benign breast exam.    Plan    The patient will resume annual screening mammograms with her primary care provider in fall 2015. She was encouraged to call should she develop any new breast issues.       Earline Mayotte 11/05/2012, 7:07 PM

## 2012-11-05 ENCOUNTER — Encounter: Payer: Self-pay | Admitting: General Surgery

## 2013-08-01 IMAGING — RF DG UGI W/ KUB
11 of 15 series · 14 of 24 positions shown · non-contrast
Comparison: none

REASON FOR EXAM: cough dyspnea  with TABLET
COMMENTS:

PROCEDURE:     FL  - FL UPPER GI W/ BARIUM SWALLOW  - May 22, 2012  [DATE]
RESULT:
TECHNIQUE: Dynamic imaging of the cervical esophagus was performed during
active swallowing of barium. This was followed by the administration of
effervescent crystals and reevaluation of the remaining components of the
esophagus and upper GI tract.

[Series 1: fluoro_barium 2fps_bw · 0.18mm/px · 2 of 14 frames shown (1 of 11)]
[frame 3/14]
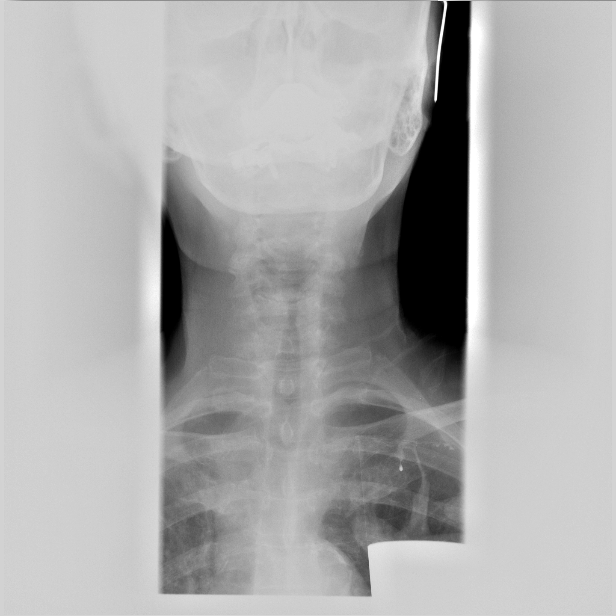
[frame 12/14]
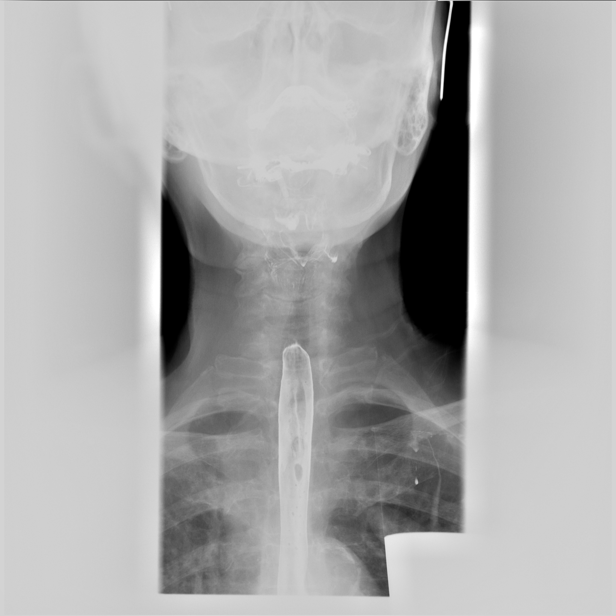

[Series 2: fluoro_barium 2fps_bw · 0.17mm/px · 2 of 13 frames shown (2 of 11)]
[frame 6/13]
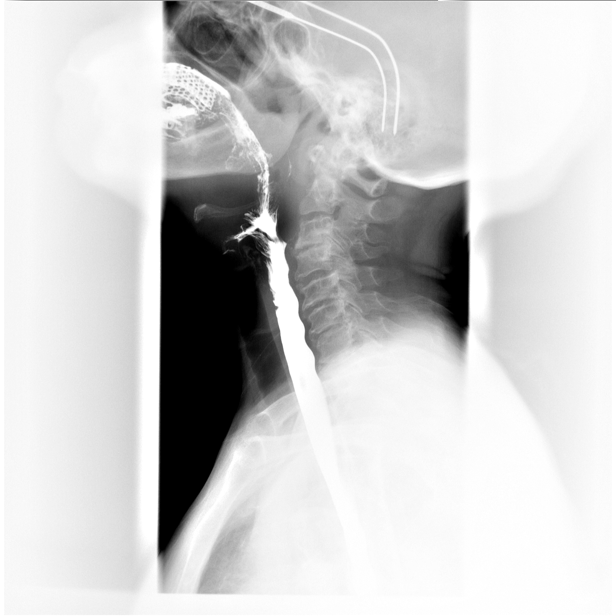
[frame 12/13]
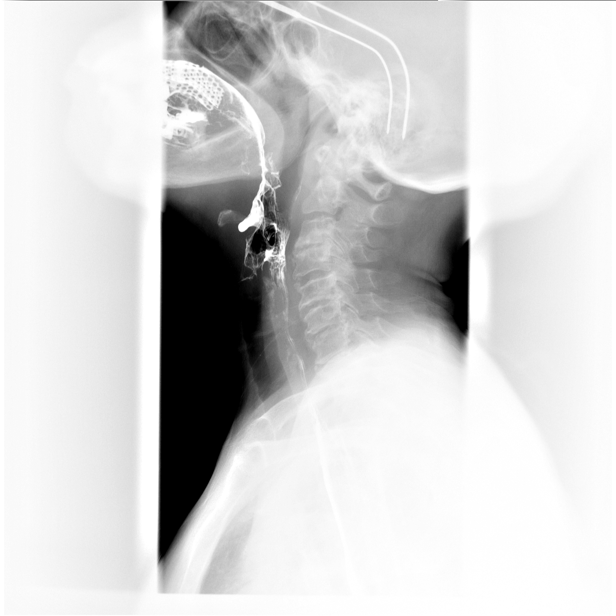

[Series 3: fluoro_barium 2fps_bw · 0.17mm/px · 2 of 12 frames shown (3 of 11)]
[frame 2/12]
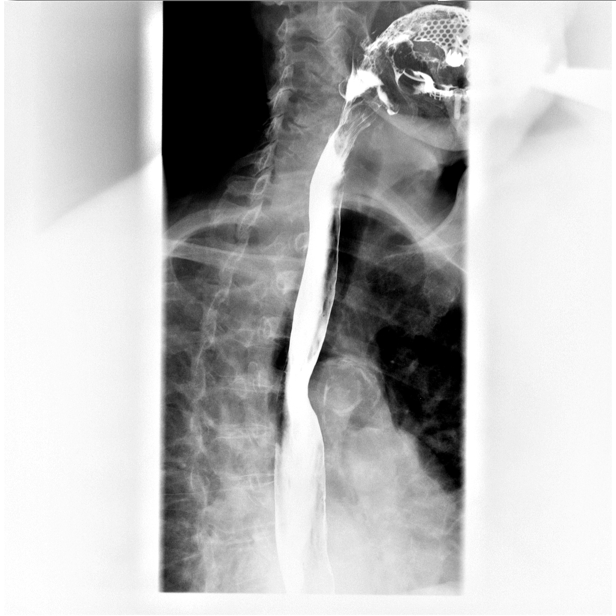
[frame 11/12]
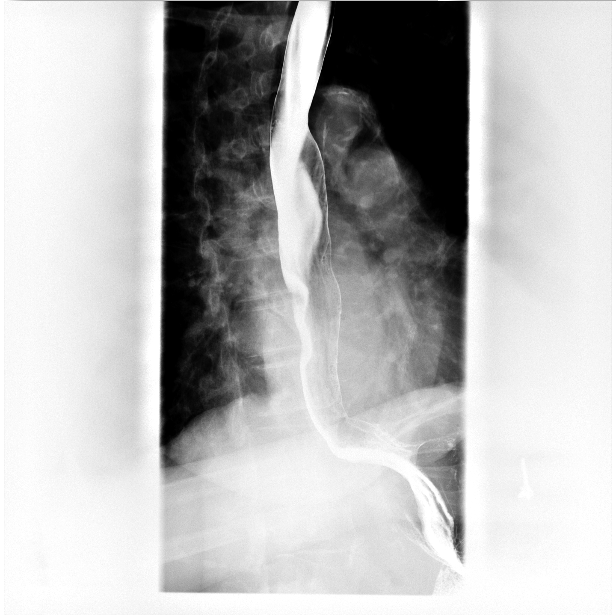

[Series 4: fluoro_barium 2fps_bw · 0.18mm/px · 1 of 1 slices shown (4 of 11)]
[im 1/1]
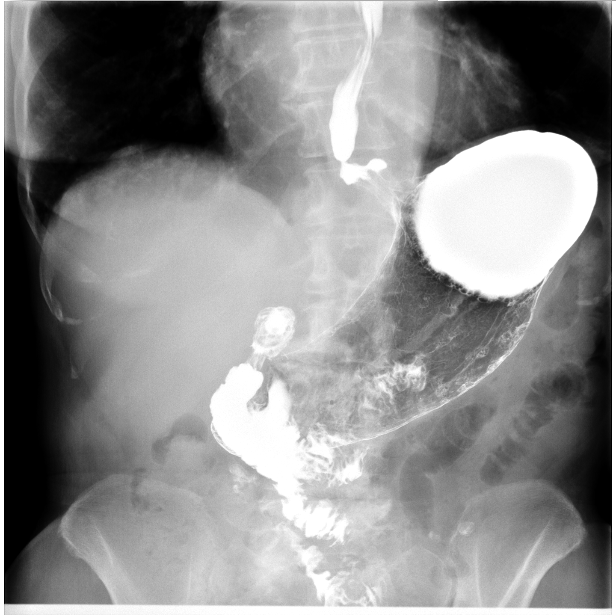

[Series 5: fluoro_barium 2fps_bw · 0.18mm/px · 1 of 1 slices shown (5 of 11)]
[im 1/1]
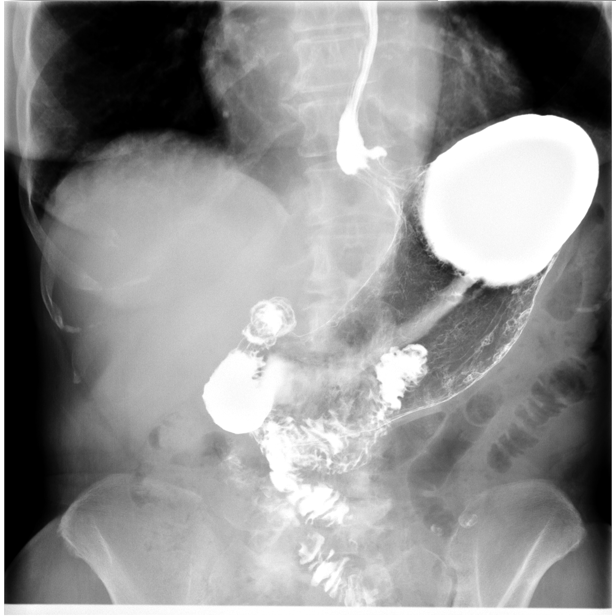

[Series 7: fluoro_barium 2fps_bw · 0.18mm/px · 1 of 1 slices shown (6 of 11)]
[im 1/1]
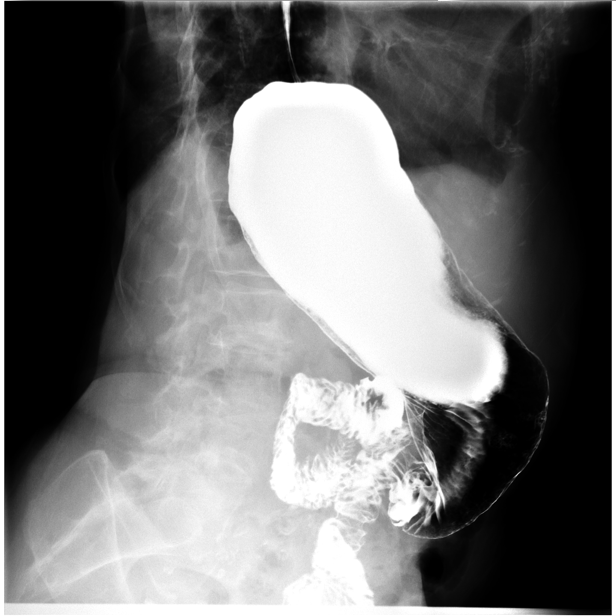

[Series 9: fluoro_barium 2fps_bw · 0.18mm/px · 1 of 1 slices shown (7 of 11)]
[im 1/1]
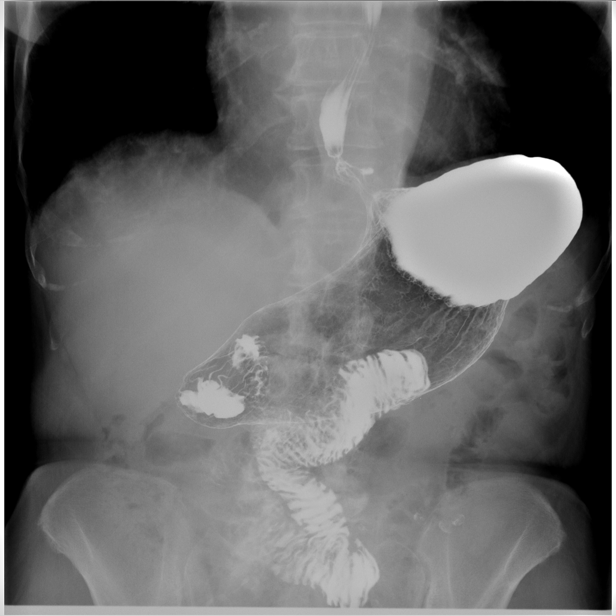

[Series 11: fluoro_barium 2fps_bw · 0.18mm/px · 1 of 1 slices shown (8 of 11)]
[im 1/1]
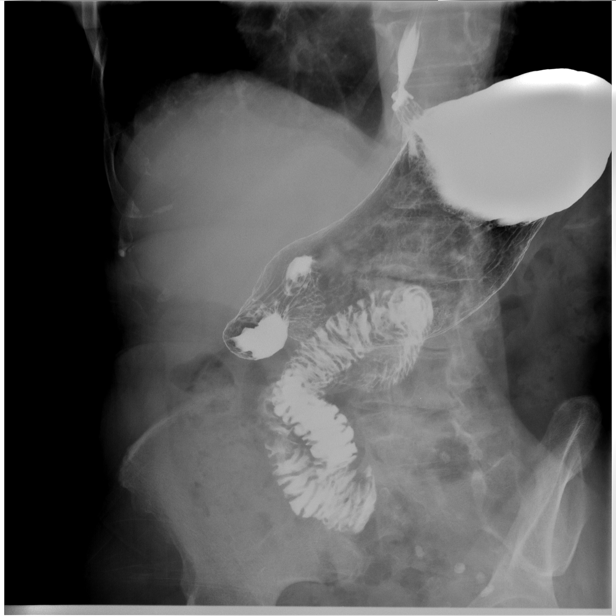

[Series 12: fluoro_barium 2fps_bw · 0.18mm/px · 1 of 2 frames shown (9 of 11)]
[frame 1/2]
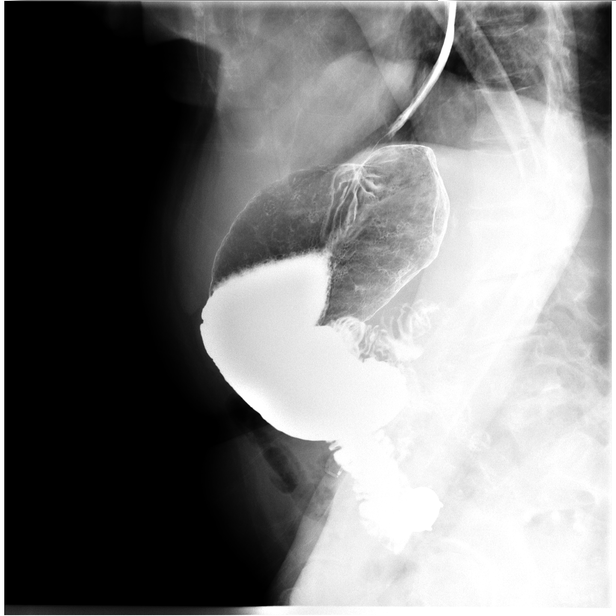

[Series 13: fluoro_barium 2fps_bw · 0.18mm/px · 1 of 1 slices shown (10 of 11)]
[im 1/1]
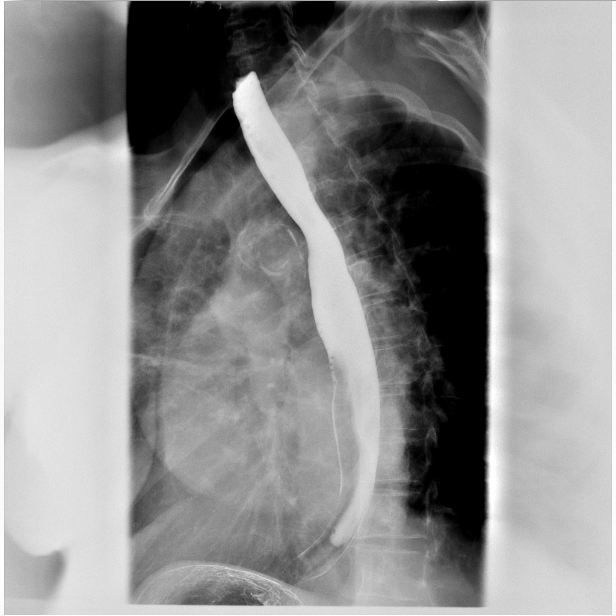

[Series 15: fluoro_barium 2fps_bw · 0.18mm/px · 1 of 1 slices shown (11 of 11)]
[im 1/1]
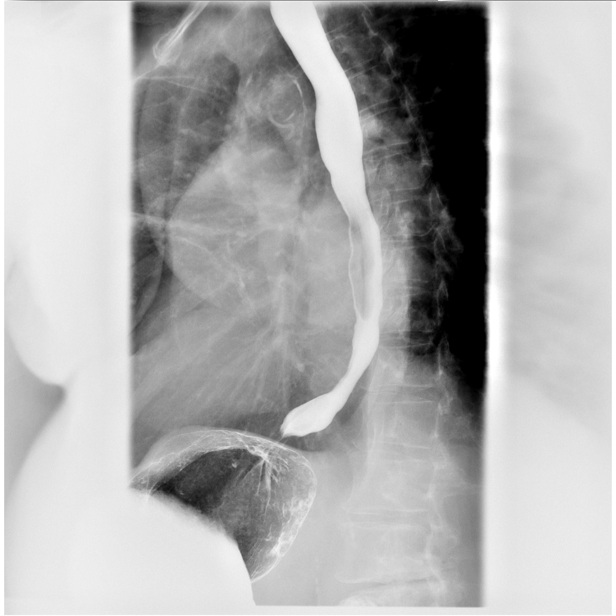

[14 of 24 positions shown; findings below may reference images not displayed]

FINDINGS: The cervical esophagus is unremarkable.

A single swallow was evaluated which demonstrated a decreased primary
stripping wave and to-and-fro motion within the esophagus. There is no
evidence of gastroesophageal reflux or hiatal hernia. A 13 mm Barotab was
administered which traversed the esophagus without complications. The
stomach, duodenum, and duodenal bulb are unremarkable. There is appropriate
rotation of the duodenum and placement of the ligament of Treitz. There is
appropriate relaxation of the lower esophageal sphincter.
IMPRESSION: 1. Mild to moderate esophageal dysmotility. Otherwise, unremarkable upper GI
barium swallow.

## 2013-10-27 ENCOUNTER — Ambulatory Visit: Payer: Self-pay | Admitting: Family Medicine

## 2013-10-27 LAB — HM MAMMOGRAPHY: HM Mammogram: NORMAL

## 2013-12-07 ENCOUNTER — Encounter: Payer: Self-pay | Admitting: General Surgery

## 2014-04-14 LAB — LIPID PANEL
Cholesterol: 199 mg/dL (ref 0–200)
HDL: 86 mg/dL — AB (ref 35–70)
LDL Cholesterol: 101 mg/dL
TRIGLYCERIDES: 61 mg/dL (ref 40–160)

## 2014-04-14 LAB — HEMOGLOBIN A1C: Hgb A1c MFr Bld: 6.4 % — AB (ref 4.0–6.0)

## 2014-05-04 LAB — HM DIABETES EYE EXAM

## 2014-05-28 NOTE — H&P (Signed)
PATIENT NAME:  Allison Francis, Allison Francis MR#:  [id number] DATE OF BIRTH:  [birth year]  DATE OF ADMISSION:  03/09/2012  ED REFERRING PHYSICIAN:  Dr. Dahlia Client.   PRIMARY CARE PHYSICIAN:  Dr. Teola Bradley.   INDICATION FOR ADMISSION: Chronic obstructive pulmonary disease exacerbation and congestive heart failure.   CHIEF COMPLAINT: Shortness of breath, worsening today but has been going on for the past 3 weeks.   HISTORY OF PRESENT ILLNESS: This is a pleasant 79 year old female with past medical history of chronic obstructive pulmonary disease, borderline diabetes, coronary artery disease status post stent placement, hypertension, hyperlipidemia, atrial fibrillation, admitted to the hospital for the above chief complaint. The patient states that for the past 3 weeks she has been following up with her PMD, Dr. Teola Bradley, and been fighting a cough and now bronchitis. She has been on and off outpatient antibiotics, which she cannot recall at this time. However, over the last couple days, she has been noticing some increasing shortness of breath. On Wednesday, she saw her primary cardiologist, Dr. Clayborn Bigness, and she was started on Xarelto and propafenone. Shortly after filling her prescription on Wednesday, by Thursday she started having some blood-tinged sputum and coughing up quite a bit of blood is what she said.  She continued to take the medication. She did not have vomiting of blood or blood from the nares. She states that today her shortness of breath became worse. She started to feel lightheaded and she decided to come to the ED. In the ED, she was noted to be hypoxic with ABG with pO2 of 50. She was satting in the low to mid 80s, at which point she was placed on a BiPAP machine and she had great improvement. Her BNP was noted to be elevated above 5000 and hospitalist services were consulted for further inpatient work-up and management.   PAST MEDICAL HISTORY:  1.  History of COPD.  2.  Borderline diabetes, diet-controlled.   3.  History of coronary artery disease, status post MI in 2001, status post cardiac catheterization and stent placement in the RCA by Dr. Clayborn Bigness.  4.  History of bilateral salpingo-oophorectomy 1978.  5.  History of hypertension for the past 26 years.  6.  History of cataract removal in 2002.  7.  History of colonoscopy done  in 2005 revealed colonic polyposis as well as diverticulosis and internal hemorrhoids.  8.  History of tobacco abuse in the remote past, quit 25 years ago.  9.  History of GERD.   10.  Hernia repair in 2011.  11.  Diagnosed with chronic bronchitis in 2011.  12.  History of gout.   ALLERGIES: SAID THAT SHE HAS HAD A MODERATE ALLERGY TO PENICILLIN WHEN SHE WAS YOUNGER, HIVES; HOWEVER, SHE STATES THAT WHENEVER SHE GOES TO THE DENTIST SHE GETS AMPICILLIN OR AMOXICILLIN AND SHE DOES NOT HAVE ANY ADVERSE REACTIONS TO THEM.     FAMILY HISTORY: Significant for diabetes in her mother and also cardiac history in both her parents. No hypertension, no CVA, no history of breast cancer.   SOCIAL HISTORY: The patient is divorced with 1 son, had 1 daughter who died in a motor vehicle accident. Used to smoked a pack a day for about 30 years, quit 20 years ago. Drinks at least a glass of wine a day. She used to work as Sales executive for [employer].   HOME MEDICATIONS:  As follows:  Losartan 100 mg daily, Nexium 40 mg daily (not taking anymore), metoprolol succinate 100 mg  daily, pravastatin 40 mg daily, digoxin 125 mcg daily, allopurinol 100 mg daily, lansoprazole DR 30 mg 30 minutes before a large meal, propafenone HCl 225 mg capsule 1 capsule twice a day, baby aspirin 81 mg daily, Xarelto 50 mg daily, Benadryl over-the-counter tablets take 1 tablet as needed for allergies.    Last hospitalization was about a year ago, she said she was admitted for bronchitis and pneumonia. Nonsmoker currently.     REVIEW OF SYSTEMS:  CONSTITUTIONAL: Positive fatigue and weakness. No  fever at home. No pain, no weight loss, no weight gain.  EYES: No blurred vision, double vision, pain or redness.  ENT: No tinnitus, ear pain, hearing loss, allergies.  RESPIRATORY: Positive cough. Positive wheeze. Positive hemoptysis. Positive dyspnea. Positive COPD. No TB. Positive pneumonia.  CARDIOVASCULAR: No chest pain, orthopnea, edema, arrhythmia. Positive dyspnea on exertion.  GASTROINTESTINAL:  No nausea, vomiting, diarrhea, abdominal pain, hematemesis, melena, ulcers. Positive GERD.  GENITOURINARY: No dysuria, hematuria, or renal calculi.  ENDO: No polyuria, nocturia or thyroid problems.  HEME/LYMPH: No anemia, easy bruising or swollen glands.  INTEGUMENTARY: No acute rashes, lesion; change in mole, hair or skin.  MUSCULOSKELETAL: No pain in neck, back, shoulder, knee caps, arthritis or cramps.  NEUROLOGIC: No numbness, weakness, dysarthria, epilepsy, tremor or vertigo.  PSYCH: No anxiety, insomnia, ADD, OCD, bipolar, depression,   PHYSICAL EXAMINATION:  As follows: VITAL SIGNS:  On the initial admission to the ED, her temperature was noted to be elevated at 100.3; blood pressure of 206/93, currently down to 169/82; pulse of 81; respirations of 26. She is currently on a BiPAP, satting at 100% with FiO2 of 70%. Very comfortable, mentating and conversing well.  GENERAL APPEARANCE: Well-developed, well-nourished female, currently wearing a BiPAP mask but in no acute respiratory failure or distress.  HEENT: PERRLA, EOMI. No scleral icterus. Denies difficulty hearing. TMs are intact. No pharyngeal erythema. Mucous membranes are mildly dry.  NECK: No thyroid enlargement. Neck is supple, nontender. There is no overt JVD. No carotid bruits. There is full range of motion.  LUNGS:  On auscultation, coarse upper airway sounds, tight chest sounds are noted, good airway entry in the upper lobes; however, poor air entry diffusing down to the lower lobes, decreased breath sounds bilaterally, more in  the posterior basilar segments. No rhonchi or no use of accessory muscles, and no crackles noted.  CARDIOVASCULAR: Currently with S1, S2, regular rate and rhythm. No murmurs. Good radial and pedal pulses. No lower extremity is noted.  ABDOMEN: Soft, nontender, nondistended. Positive bowel sounds. No hepatosplenomegaly.   MUSCULOSKELETAL: Five out of 5 strength in bilateral upper and lower extremities. No cyanosis.  SKIN: No rash, lesions, erythema, nodules. Skin is warm and dry.  LYMPHATIC: No adenopathy noted in the cervical, axilla or supraclavicular regions.  NEURO: Cranial nerves II through XII  intact. Motor intact PSYCHIATRIC: Alert and oriented to time and place. Cooperative. Good judgment is noted.   PERTINENT LABS: In the ED were as follows: Her BNP was 5687. Glucose of 301, creatinine 0.77, sodium 137, potassium 3.5, chloride at 103, bicarbonate of 26, BUN at 14, creatinine of 0.77, total protein at 6.6, albumin 3.2, bilirubin at 0.5, alkaline phosphatase at 140, AST 63, ALT 103. Troponin elevated and CK total is 32. White cell count 16.7, hemoglobin 11.8, hematocrit 36.8, platelet count at 217. Chest x-ray shows mild hyperinflation with, by my read, right lower lobe infiltrate, enlarged heart, vascular congestion is also noted. EKG is currently pending.   ASSESSMENT AND  PLAN: As follows: This is a 79 year old female with past medical history of atrial fibrillation, chronic obstructive pulmonary disease, coronary artery disease status post stent placement, being admitted for chronic obstructive pulmonary disease exacerbation and dyspnea.  1.  Chronic obstructive pulmonary disease exacerbation, most likely the cause of her dyspnea. Will start the patient on p.o. prednisone, give her Rocephin and azithromycin IV, limit her fluid intake. Also at this time, we will place her on intermittent BiPAP as needed. Continue with supplemental oxygen. Start her on DuoNeb scheduled q.6, q.2 p.r.n. and  continue with her Advair 250/50.  Chronic obstructive pulmonary disease most likely secondary to right lower lobe pneumonia and infiltrate seen on chest x-ray.  2.  Right lower lobe pneumonia. This is most likely the cause of her symptoms for the past 3 weeks. I suspect she was having more bronchitis and is more likely probably a community-acquired pneumonia. She has not been in the hospital in about a year, so we will not treat as a hospital-acquired or healthcare-associated pneumonia. We will continue with Rocephin and azithromycin. Check blood cultures, monitor her leukocytosis and her hemoglobins also.  3.  Atrial fibrillation, currently on digoxin and propafenone. We will continue these 2 medications. We will hold her Xarelto since she is having blood-tinged sputum and questionable overt hemoptysis. Hemoglobin mildly decreased, the last record we have of hemoglobin was 13.3 in November 2012, is currently at 11.8. We will check another hemoglobin in the morning and continue to monitor closely.  4.  Hypertension. Continue with her p.o. medications which include metoprolol and losartan.  Monitor her BNP.  5.  Congestive heart failure. We will consult Dr. Clayborn Bigness, who is her primary cardiologist. We will also get an echocardiogram.  We will fluid restrict her and continue with blood pressure management. I suspect she has a mild congestive heart failure exacerbation secondary to her right lower lobe pneumonia triggering her congestive heart failure.  6.  Atrial fibrillation, currently on digoxin, propafenone, and Xarelto.  We will hold Xarelto secondary to upon initiation she was having blood-tinged sputum and questionable hemoptysis.   The patient is a full code. Primary care physician is Dr. Teola Bradley. Primary cardiologist is Dr. Clayborn Bigness.   TIME SPENT DICTATING AND EVALUATING THE PATIENT: 50 minutes.   JOB NUMBER: [id number]  ____________________________ Vilinda Boehringer, MD vm:cs D: 03/09/2012 03:11:00  ET T: 03/09/2012 19:53:17 ET JOB#: [id number]  cc: Vilinda Boehringer, MD, <Dictator> Vilinda Boehringer MD ELECTRONICALLY SIGNED 03/10/2012 13:37

## 2014-05-28 NOTE — H&P (Signed)
Past Med/Surgical Hx:  COPD:   Hypertension:   Stent, Cardiac:   ALLERGIES:  Penicillin: Hives  NKA: None    Assessment/Admission Diagnosis 79 yo with PMHx of COPD, HTN, Afib, CAD s\p RCA Stent admitted with COPD exacerbation and RLL PNA  1. COPD Exacerbation - most likely the cause of her continued dyspnea - secondary to RLL PNA - start prednisone 40mg  x 5 days, rocephin\azithro, duonebs, intermittent bipap, O2, advair - cont with supportive care  2. RLL PNA  - treated as bronchitis with levaquin as an outpatient - will start rocephin and azithromycin - spirometry - supportive care' - check blood cultures  3. Afib - rate controlled - on digoxin, propanfenone and xarelta - will stop xarelta since she is having blood tinged sputum - her BNP=5600, fluid restrict and monitor I\Os - check ECHO - trend CE - consult cardiology, Dr. Clayborn Bigness, regarding anticoagulation and elevated BNP  4. Leukocytosis\elevated temp - most likely secondary to RLL PNA and previously being steroids - will continue to monitor.   5. Anemia - possibly blood loss from xarelta - will monitor H\H - transfuse if needed, or hemodynamic instability  FULL CODE PMD - Dr. Ancil Boozer  Time spent evaluating patient = 55 minutes [id number]   Electronic Signatures: Vilinda Boehringer (MD)  (Signed 02-Feb-14 03:28)  Authored: PAST MEDICAL/SURGIAL HISTORY, ALLERGIES, HOME MEDICATIONS, ASSESSMENT AND PLAN   Last Updated: 02-Feb-14 03:28 by Vilinda Boehringer (MD)

## 2014-05-28 NOTE — Consult Note (Signed)
General Aspect 79 yo female with history of cad s/p pci of rca, history of copd, history of afib treated with xarelto for anticoagulation and propafenone for rate control. She is now admitted with shortness of breath and cough productive of blood tinged sputum and blood from her nose. EKG has baseline artifact making rhythm difficult to assess but appears to have a regular rhythm. CXR reveals probable pneumonia vs bronchitis. SHe is being treated with abx for this.  She is not anemic and is hemodynamically stable. Her cardiac markers are minimally elevated. She has been taken off of xarelto and has no further bleeding. She deneis syncope or presyncoope.   Physical Exam:   GEN thin    HEENT PERRL, hearing intact to voice    NECK supple    RESP no use of accessory muscles  wheezing  rhonchi    CARD Regular rate and rhythm  Murmur    Murmur Systolic    Systolic Murmur axilla    ABD denies tenderness  normal BS    LYMPH negative neck    EXTR negative cyanosis/clubbing    SKIN normal to palpation    NEURO cranial nerves intact, motor/sensory function intact    PSYCH A+O to time, place, person   Review of Systems:   Subjective/Chief Complaint cough, shortness of breath and blood tinged sputum    General: Fatigue  Fever/chills    Skin: No Complaints    ENT: No Complaints    Eyes: No Complaints    Neck: No Complaints    Respiratory: Frequent cough  Hemopytsis  Short of breath  Wheezing  Sputum    Cardiovascular: Dyspnea    Gastrointestinal: No Complaints    Genitourinary: No Complaints    Vascular: No Complaints    Musculoskeletal: No Complaints    Neurologic: No Complaints    Hematologic: Ease of bleeding  on xarelto    Endocrine: No Complaints    Psychiatric: No Complaints    Review of Systems: All other systems were reviewed and found to be negative    Medications/Allergies Reviewed Medications/Allergies reviewed     COPD:    Hypertension:     Stent, Cardiac:   Home Medications: Medication Instructions Status  allopurinol 100 mg oral tablet 1 tab(s) orally once a day Active  Ecotrin Adult Low Strength 81 mg oral delayed release tablet 1 tab(s) orally once a day Active  Spiriva 18 mcg inhalation capsule 1 each inhaled once a day Active  Advair Diskus 250 mcg-50 mcg inhalation powder 1 puff(s) inhaled 2 times a day Active  losartan 100 mg oral tablet 1 tab(s) orally once a day Active  Metoprolol Succinate ER 100 mg oral tablet, extended release 1 tab(s) orally once a day Active  lansoprazole 30 mg oral delayed release capsule 1 cap(s) orally once a day Active  propafenone 225 mg oral capsule, extended release 1 cap(s) orally every 12 hours Active  pravastatin 40 mg oral tablet 1 tab(s) orally once a day (at bedtime) Active  Nexium 40 mg oral delayed release capsule 1 cap(s) orally once a day Active  Lanoxin 125 mcg (0.125 mg) oral tablet 1 tab(s) orally once a day Active  Xarelto 15 mg oral tablet 1 tab(s) orally once a day (in the evening) Active   EKG:   Abnormal NSSTTW changes    Interpretation baseline artifact; afib vs sr with artifact    Penicillin: Hives  NKA: None    Impression 79 yo female with afib with  treatment with propafanone and digoxin and recently anticoagulated with xarelto. She has developed a cough productive of sputum. Has been treated for bronchitis as outpatient but has worsened now with cough procudtive of blood tinged sputum. She also complains of bloody nose. SHe denies gi bleeding. She has evidence of pneumonia. Agree with holding xarelto for now adn treating with steoids and antibiotics. Echo pending.    Plan 1. Continue propafenone and digoxin 2. Hold xarelto for now 3  Review echo when available 4. Treat bronchitis/pneumonia 5. Further recs regarding anticoaulation at discharge but hemoptysis appears to be secondary to pneumonina and xarelto may be tolerated when this has been treated.    Electronic Signatures: Teodoro Spray (MD)  (Signed 02-Feb-14 11:05)  Authored: General Aspect/Present Illness, History and Physical Exam, Review of System, Past Medical History, Home Medications, EKG , Allergies, Impression/Plan   Last Updated: 02-Feb-14 11:05 by Teodoro Spray (MD)

## 2014-05-28 NOTE — Discharge Summary (Signed)
PATIENT NAME:  Allison Francis, Allison Francis MR#:  [id number] DATE OF BIRTH:  1935-11-05  DATE OF ADMISSION:  03/10/2012 DATE OF DISCHARGE:  03/11/2012  For a detailed note, please take a look at the history and physical on admission by Dr. Stevenson Clinch.   DIAGNOSES AT DISCHARGE: 1.  Chronic obstructive pulmonary disease exacerbation, likely secondary to bronchitis.  2.  Chronic atrial fibrillation.  3.  Hypertension.  4.  Gastroesophageal reflux disease.  5.  Hyperlipidemia.  6.  Diabetes.   DIET:  The patient is being discharged on a low sodium, low fat, carb-controlled diet.   ACTIVITY:  As tolerated.   FOLLOW-UP:  In the next 1 to 2 weeks with Dr. Steele Sizer and also with Dr. Clayborn Bigness in the next 1 to 2 weeks.   DISCHARGE MEDICATIONS:  Are as follows:  Allopurinol 100 mg daily, aspirin 81 mg daily, Spiriva 1 puff daily, Advair 250/50 1 puff twice daily, losartan 100 mg daily, metoprolol succinate 100 mg daily, propafenone 225 mg twice daily, Pravachol 40 mg daily, Nexium 40 mg daily, digoxin 0.25 mg daily, prednisone taper starting at 60 mg down to 10 mg over the next 6 days, Levaquin 500 mg daily x 5 days and glipizide 5 mg daily.  The patient is being told to discontinue her Xarelto.   PERTINENT STUDIES DONE DURING HER HOSPITAL COURSE:  Are as follows:  A 2-dimensional echocardiogram showing left ventricle to be mildly dilated, EF of 45% to 50%.  Mild left ventricular thickness, mild global hypokinesis of the left ventricle, moderate to severe mitral regurgitation, severe tricuspid regurgitation.   A blood culture to be positive for coagulase-negative staph.   BRIEF HOSPITAL COURSE:  This is a 79 year old female who presented to the hospital on 03/09/2012 secondary to shortness of breath and cough, noted to be in COPD exacerbation.  1.  COPD exacerbation, most likely the cause of the patient's COPD exacerbation was a suspected right lower lobe pneumonia/bronchitis.  The patient was brought to the  hospital, started on steroids, around-the-clock nebulizer treatments, maintain her Advair, Spiriva and also empiric antibiotics were started.  The patient's shortness of breath and clinical symptoms have significantly improved.  She currently is being discharged on a by mouth prednisone taper and empiric Levaquin.  As stated, her sputum cultures did not grow anything at this time.  Her blood cultures were negative.  I did ambulate her on room air and she did desaturate to about 81% and did qualify for home O2 which I offered to the patient, but she refused to take home oxygen at this point.  She will continue present medications for her COPD including her Advair, Spiriva as stated.  2.  A suspected right lower lobe pneumonia.  The patient's chest x-ray was suggestive of this on admission.  She was treated with IV ceftriaxone and Zithromax in the hospital and is currently being discharged on by mouth Levaquin.  3.  Gout.  The patient had no evidence of any gout attack.  She will resume her allopurinol as an outpatient.  4.  Chronic atrial fibrillation.  The patient remained in normal sinus rhythm and rate controlled in the hospital.  She will continue propafenone, digoxin and metoprolol.  She was on Xarelto prior to coming in, but she started having some mild bloody sputum, therefore the Xarelto was discontinued.  She will continue follow-up with her cardiologist, Dr. Clayborn Bigness to decide whether she further needs to go back on Xarelto if she presently remains in a  sinus rhythm.  5.  GERD.  The patient was maintained on her Nexium and she will resume that.  6.  Hypertension.  The patient remained hemodynamically stable on her metoprolol and losartan and she will resume that.  7.  Hyperlipidemia.  The patient was maintained on her Pravachol.  She will resume that.  8.  Diabetes.  The patient apparently had diet-controlled diabetes, although her blood sugars remained kind of high as she was getting steroids in the  hospital.  Hemoglobin A1c was noted to be as high as 8.  She is currently not taking anything.  As mentioned, she was on metformin, had a bad reaction to it, therefore I discharged her on some low-dose glipizide for now.   CODE STATUS:  THE PATIENT IS A FULL CODE.    TIME SPENT:  Is 40 minutes.      ____________________________ Allison Heman. Verdell Carmine, MD vjs:ea D: 03/11/2012 16:22:46 ET T: 03/12/2012 04:25:45 ET JOB#: [id number]  cc: Allison Heman. Verdell Carmine, MD, <Dictator> Bethena Roys. Ancil Boozer, MD Dr. Clayborn Bigness Henreitta Leber MD ELECTRONICALLY SIGNED 03/12/2012 8:33

## 2014-05-30 NOTE — Consult Note (Signed)
PATIENT NAME:  Allison Francis, Allison Francis MR#:  [id number] DATE OF BIRTH:  06/20/1935  DATE OF CONSULTATION:  12/30/2010  REFERRING PHYSICIAN:  Theodoro Grist, MD, [facility]  CONSULTING PHYSICIAN:  Elia Nunley D. Clayborn Bigness, MD  PRIMARY CARE PHYSICIAN: Ashok Norris, MD   INDICATION: Shortness of breath, known coronary disease.  HISTORY OF PRESENT ILLNESS: Allison Francis is a 79 year old African American female well known to me with history of angioplasty and stenting, chronic obstructive pulmonary disease, diabetes, coronary disease, myocardial infarction in the past, past history of smoking and reflux. She complains of recent cough, congestion, shortness of breath, chest rattling, sputum production greater over the last 2 to 3 weeks and has gotten progressively worse. It was particularly worse over the last week with coughing. Her chest was sore from coughing she states. She finally went to Urgent Care and after evaluation she was referred to the Emergency Room for admission.   REVIEW OF SYSTEMS: No blackout spells or syncope. No nausea or vomiting. No fever or chills. No sweats. No weight loss. No weight gain. No hemoptysis or hematemesis. No bright red blood per rectum. She's had shortness of breath, congestion, cough, and chest soreness.  PAST MEDICAL HISTORY: 1. Chronic obstructive pulmonary disease. 2. Borderline diabetes. 3. Coronary disease. 4. Myocardial infarction. 5. Hypertension. 6. Smoking. 7. Reflux. 8. Gout.  PAST SURGICAL HISTORY: 1. Foot surgery. 2. Angioplasty and stenting. 3. Hysterectomy. 4. Cataract removal. 5. Colonoscopy.  FAMILY HISTORY: Diabetes, myocardial infarction, multiple myeloma, breast cancer.  SOCIAL HISTORY: Divorced. Retired. Has a son. Remote smoker. No alcohol consumption. Volunteers at the hospital.  ALLERGIES: Penicillin.  PHYSICAL EXAMINATION:  VITAL SIGNS: Blood pressure 120/80, pulse 124 but now is 80, respiratory rate 16, afebrile.  HEENT: Normocephalic  and atraumatic. Pupils equal and reactive to light.  NECK: Supple. No JVD, bruits, or adenopathy.  LUNGS: Clear to auscultation and percussion with bilateral rhonchi. No significant rales. No wheezing.  HEART: Now has regular rhythm. Positive S4. Systolic ejection murmur left sternal border.   ABDOMEN: Benign.  EXTREMITIES: Within normal limits.  NEUROLOGIC: Intact.  SKIN: Normal.  LABORATORY, DIAGNOSTIC, AND RADIOLOGICAL DATA: Initial EKG had atrial fibrillation, rapid ventricular response, nonspecific ST-T changes, rate 135.   Glucose 135, BUN 22. BNP 1589. LFTs negative. CBC normal. White count 6.6, hemoglobin 14.6, platelet count 195.   Follow-up EKG abnormal sinus rhythm, mild LVH, nonspecific findings.  ASSESSMENT:  1. Shortness of breath. 2. Coronary disease. 3. Chronic obstructive pulmonary disease. 4. Bronchitis. 5. Borderline diabetes. 6. Hypertension. 7. Hypoxemia. 8. Congestive heart failure. 9. Abnormal EKG. 10. Reflux. 11. Gout.  PLAN: 1. I recommend further evaluation. The patient was in atrial fibrillation, now converted back to sinus rhythm. 2. Continue aspirin therapy. 3. Continue rate control with beta-blockers. 4. Will defer decision about long-term anti-coagulation for now. 5. Will continue therapy for coronary disease. 6. Respiratory therapy should be continued for bronchitis and chronic obstructive pulmonary disease. 7. Inhalers. 8. Antibiotics. 9. Probably hold off on steroids. 10. Supplemental oxygen may be helpful. 11. Congestive heart failure. Possibly check echocardiogram. I'm worried about depressed LV function. 12. Continue therapy with atenolol, ARBs, aspirin, and consider whether low dose Lasix would be necessary or Digoxin would help heart failure as well as atrial fibrillation. For now, will try to treat the patient conservatively and treat her respiratory lung problem which may have contributed to the atrial fibrillation and see how  she responds. Again, will follow-up echocardiogram and see whether the results influences our final decision  but I suspect adding an ACE inhibitor in addition to the ARB, beta-blockers, aspirin, as well as statin which she's received in the past and see how the patient improves.  ____________________________ [name], MD ddc:drc D: 01/01/2011 09:47:00 ET T: 01/01/2011 10:11:34 ET JOB#: [id number] cc: Circe Chilton D. Clayborn Bigness, MD, <Dictator> Yolonda Kida MD ELECTRONICALLY SIGNED 02/09/2011 14:00

## 2014-08-11 ENCOUNTER — Other Ambulatory Visit: Payer: Self-pay | Admitting: Family Medicine

## 2014-08-17 ENCOUNTER — Ambulatory Visit (INDEPENDENT_AMBULATORY_CARE_PROVIDER_SITE_OTHER): Payer: Medicare Other | Admitting: Family Medicine

## 2014-08-17 ENCOUNTER — Encounter: Payer: Self-pay | Admitting: Family Medicine

## 2014-08-17 VITALS — BP 138/80 | HR 71 | Temp 98.3°F | Resp 18 | Ht 60.0 in | Wt 97.4 lb

## 2014-08-17 DIAGNOSIS — G4700 Insomnia, unspecified: Secondary | ICD-10-CM

## 2014-08-17 DIAGNOSIS — K219 Gastro-esophageal reflux disease without esophagitis: Secondary | ICD-10-CM | POA: Diagnosis not present

## 2014-08-17 DIAGNOSIS — M810 Age-related osteoporosis without current pathological fracture: Secondary | ICD-10-CM | POA: Diagnosis not present

## 2014-08-17 DIAGNOSIS — I10 Essential (primary) hypertension: Secondary | ICD-10-CM | POA: Diagnosis not present

## 2014-08-17 DIAGNOSIS — E119 Type 2 diabetes mellitus without complications: Secondary | ICD-10-CM | POA: Diagnosis not present

## 2014-08-17 DIAGNOSIS — R0902 Hypoxemia: Secondary | ICD-10-CM | POA: Diagnosis not present

## 2014-08-17 DIAGNOSIS — I2510 Atherosclerotic heart disease of native coronary artery without angina pectoris: Secondary | ICD-10-CM

## 2014-08-17 DIAGNOSIS — H2013 Chronic iridocyclitis, bilateral: Secondary | ICD-10-CM | POA: Insufficient documentation

## 2014-08-17 DIAGNOSIS — M109 Gout, unspecified: Secondary | ICD-10-CM

## 2014-08-17 DIAGNOSIS — K589 Irritable bowel syndrome without diarrhea: Secondary | ICD-10-CM | POA: Insufficient documentation

## 2014-08-17 DIAGNOSIS — J449 Chronic obstructive pulmonary disease, unspecified: Secondary | ICD-10-CM

## 2014-08-17 LAB — POCT UA - MICROALBUMIN: MICROALBUMIN (UR) POC: 20 mg/L

## 2014-08-17 MED ORDER — OMEPRAZOLE 20 MG PO CPDR: 20.0000 mg | DELAYED_RELEASE_CAPSULE | Freq: Every day | ORAL | Status: AC

## 2014-08-17 MED ORDER — MIRTAZAPINE 15 MG PO TABS: 15.0000 mg | ORAL_TABLET | Freq: Every day | ORAL | Status: DC

## 2014-08-17 MED ORDER — LOSARTAN POTASSIUM 100 MG PO TABS: 100.0000 mg | ORAL_TABLET | Freq: Every day | ORAL | Status: AC

## 2014-08-17 MED ORDER — AMLODIPINE BESYLATE 5 MG PO TABS: 5.0000 mg | ORAL_TABLET | Freq: Every day | ORAL | Status: AC

## 2014-08-17 MED ORDER — METOPROLOL SUCCINATE ER 50 MG PO TB24: 50.0000 mg | ORAL_TABLET | Freq: Every day | ORAL | Status: AC

## 2014-08-17 MED ORDER — ALLOPURINOL 100 MG PO TABS: 100.0000 mg | ORAL_TABLET | Freq: Every day | ORAL | Status: AC

## 2014-08-17 NOTE — Progress Notes (Signed)
Name: Allison Francis   MRN: [id number]    DOB: March 26, 1935   Date:08/17/2014       Progress Note  Subjective  Chief Complaint  Chief Complaint  Patient presents with  . Hypertension  . Gastrophageal Reflux  . COPD    cough,SOB (oxygen is also really low today)    HPI  DMII : diet controlled DM, denies polyphagia, polydipsia or polyuria. She does not check fsbs at home.  She denies hypoglycemia.    HTN: taking medication, denies side effects of medications.   COPD: she sees Dr. Raul Del, pulse ox has been low, states usually around 87% when she goes to [employer] and it goes up to around 92% before she goes home after activity. She has a daily morning cough, that has white sputum. Taking Advair daily , not using oxygen at home, but also has cough medications to control symptoms.   Insomnia and lack of appetite: she is taking Remeron and weight has been stable now, discussed adding Glucerna in the afternoons  Osteoporosis: she was diagnosed in 2014, she refuses to take medications for osteoporosis, she is aware of risk of fracture.   GERD: controlled with medication, denies any regurgitation or heartburn at this time  Patient Active Problem List   Diagnosis Date Noted  . Chronic iritis of both eyes 08/17/2014  . Osteoporosis 08/17/2014  . Insomnia 08/17/2014  . Lump or mass in breast 11/04/2012  . Acid reflux 08/14/2012  . Diet-controlled diabetes mellitus 08/14/2012  . Cardiomyopathy 08/13/2012  . A-fib 08/13/2012  . Controlled gout 08/13/2012  . CAD (coronary artery disease) 03/27/2011  . Smoking history 03/27/2011  . COPD, severe 03/27/2011  . Systolic CHF 23/30/0762  . HTN (hypertension) 03/27/2011    Past Surgical History  Procedure Laterality Date  . Cardiac catheterization  01-02-00    cardiac stent, RCA  . Hernia repair  2011  . Coronary angioplasty with stent placement  2011  . Colonoscopy  2005    Family History  Problem Relation Age of Onset  .  Multiple myeloma Mother   . Heart attack Mother   . Breast cancer Mother   . Hypertension      fx  . Diabetes Mother     History   Social History  . Marital Status: Single    Spouse Name: N/A  . Number of Children: N/A  . Years of Education: N/A   Occupational History  . Not on file.   Social History Main Topics  . Smoking status: Former Smoker -- 1.00 packs/day for 35 years    Types: Cigarettes    Quit date: 02/06/1996  . Smokeless tobacco: Never Used  . Alcohol Use: 3.6 oz/week    6 Standard drinks or equivalent per week  . Drug Use: No  . Sexual Activity: Not Currently   Other Topics Concern  . Not on file   Social History Narrative     Current outpatient prescriptions:  .  allopurinol (ZYLOPRIM) 100 MG tablet, Take 1 tablet (100 mg total) by mouth daily., Disp: 30 tablet, Rfl: 5 .  amLODipine (NORVASC) 5 MG tablet, Take 1 tablet (5 mg total) by mouth daily., Disp: 30 tablet, Rfl: 5 .  aspirin EC 81 MG tablet, Take 81 mg by mouth daily., Disp: , Rfl:  .  digoxin (LANOXIN) 0.125 MG tablet, Take 1 tablet by mouth daily., Disp: , Rfl: 2 .  Fluticasone-Salmeterol (ADVAIR DISKUS) 250-50 MCG/DOSE AEPB, Inhale 1 puff into the lungs every  12 (twelve) hours., Disp: , Rfl:  .  HYDROcodone-homatropine (HYCODAN) 5-1.5 MG/5ML syrup, Take by mouth as needed., Disp: , Rfl:  .  lidocaine (LIDODERM) 5 %, Apply 1 patch topically daily. Remove & Discard patch within 12 hours or as directed by MD, Disp: , Rfl:  .  losartan (COZAAR) 100 MG tablet, Take 1 tablet (100 mg total) by mouth daily., Disp: 30 tablet, Rfl: 5 .  metoprolol succinate (TOPROL-XL) 50 MG 24 hr tablet, Take 1 tablet (50 mg total) by mouth daily. Take with or immediately following a meal., Disp: 30 tablet, Rfl: 5 .  mirtazapine (REMERON) 15 MG tablet, Take 1 tablet (15 mg total) by mouth daily., Disp: 30 tablet, Rfl: 5 .  omeprazole (PRILOSEC) 20 MG capsule, Take 1 capsule (20 mg total) by mouth daily., Disp: 30  capsule, Rfl: 5 .  prednisoLONE acetate (PRED FORTE) 1 % ophthalmic suspension, PLACE 1 DROP INTO BOTH EYES EVERY DAY AS NEEDED, Disp: , Rfl: 3  Allergies  Allergen Reactions  . Metformin Hcl Diarrhea  . Penicillins Hives     ROS  Constitutional: Negative for fever or weight change.  Respiratory: Positive  for cough and shortness of breath.   Cardiovascular: Negative for chest pain or palpitations.  Gastrointestinal: Negative for abdominal pain, no bowel changes.  Musculoskeletal: Negative for gait problem or joint swelling.  Skin: Negative for rash.  Neurological: Negative for dizziness or headache.  No other specific complaints in a complete review of systems (except as listed in HPI above). Objective  Filed Vitals:   08/17/14 0809  BP: 138/80  Pulse: 71  Temp: 98.3 F (36.8 C)  TempSrc: Oral  Resp: 18  Height: 5' (1.524 m)  Weight: 97 lb 6.4 oz (44.18 kg)  SpO2: 87%    Body mass index is 19.02 kg/(m^2).  Physical Exam   Constitutional: Patient appears well-developed and well-nourished. No distress.  Eyes:  No scleral icterus.  Neck: Normal range of motion. Neck supple. Cardiovascular: Normal rate, regular rhythm and normal heart sounds.  No murmur heard. No BLE edema. Pulmonary/Chest: Effort normal , breat sounds rhonchi on posterior lung field, no wheezing or crackles.  No respiratory distress. Abdominal: Soft.  There is no tenderness. Psychiatric: Patient has a normal mood and affect. behavior is normal. Judgment and thought content normal.   Diabetic Foot Exam: Diabetic Foot Exam - Simple   Simple Foot Form  Visual Inspection  See comments:  Yes  Sensation Testing  Intact to touch and monofilament testing bilaterally:  Yes  Pulse Check  Posterior Tibialis and Dorsalis pulse intact bilaterally:  Yes  Comments  Corn on left 4th toe       PHQ2/9: Depression screen PHQ 2/9 08/17/2014  Decreased Interest 0  Down, Depressed, Hopeless 0  PHQ - 2 Score  0    Fall Risk: Fall Risk  08/17/2014  Falls in the past year? No      Assessment & Plan  1. COPD, severe Keep follow up with Dr. Raul Del  2. Gastroesophageal reflux disease without esophagitis  - omeprazole (PRILOSEC) 20 MG capsule; Take 1 capsule (20 mg total) by mouth daily.  Dispense: 30 capsule; Refill: 5  3. Diet-controlled diabetes mellitus Last hgbA1C 6.4 in March 2016 - POCT UA - Microalbumin  4. Coronary artery disease involving native coronary artery of native heart without angina pectoris Stable, no symptoms of CHF or afib at this time, denies angina - metoprolol succinate (TOPROL-XL) 50 MG 24 hr tablet; Take 1  tablet (50 mg total) by mouth daily. Take with or immediately following a meal.  Dispense: 30 tablet; Refill: 5 - losartan (COZAAR) 100 MG tablet; Take 1 tablet (100 mg total) by mouth daily.  Dispense: 30 tablet; Refill: 5  5. Osteoporosis Refuses medication   6. Insomnia Doing well at this time - mirtazapine (REMERON) 15 MG tablet; Take 1 tablet (15 mg total) by mouth daily.  Dispense: 30 tablet; Refill: 5  7. Essential hypertension At goal  - metoprolol succinate (TOPROL-XL) 50 MG 24 hr tablet; Take 1 tablet (50 mg total) by mouth daily. Take with or immediately following a meal.  Dispense: 30 tablet; Refill: 5 - losartan (COZAAR) 100 MG tablet; Take 1 tablet (100 mg total) by mouth daily.  Dispense: 30 tablet; Refill: 5 - amLODipine (NORVASC) 5 MG tablet; Take 1 tablet (5 mg total) by mouth daily.  Dispense: 30 tablet; Refill: 5  8. Controlled gout  - allopurinol (ZYLOPRIM) 100 MG tablet; Take 1 tablet (100 mg total) by mouth daily.  Dispense: 30 tablet; Refill: 5

## 2014-08-24 ENCOUNTER — Other Ambulatory Visit: Payer: Self-pay | Admitting: Obstetrics and Gynecology

## 2014-08-24 DIAGNOSIS — Z1231 Encounter for screening mammogram for malignant neoplasm of breast: Secondary | ICD-10-CM

## 2014-10-14 ENCOUNTER — Other Ambulatory Visit: Payer: Self-pay | Admitting: Family Medicine

## 2014-10-18 ENCOUNTER — Other Ambulatory Visit: Payer: Self-pay | Admitting: Specialist

## 2014-10-18 DIAGNOSIS — R053 Chronic cough: Secondary | ICD-10-CM

## 2014-10-18 DIAGNOSIS — R918 Other nonspecific abnormal finding of lung field: Secondary | ICD-10-CM

## 2014-10-18 DIAGNOSIS — R05 Cough: Secondary | ICD-10-CM

## 2014-10-22 ENCOUNTER — Ambulatory Visit
Admission: RE | Admit: 2014-10-22 | Discharge: 2014-10-22 | Disposition: A | Discharge status: Home / Self Care | Payer: Medicare Other | Source: Ambulatory Visit | Attending: Specialist | Admitting: Specialist

## 2014-10-22 DIAGNOSIS — R053 Chronic cough: Secondary | ICD-10-CM

## 2014-10-22 DIAGNOSIS — I700 Atherosclerosis of aorta: Secondary | ICD-10-CM | POA: Insufficient documentation

## 2014-10-22 DIAGNOSIS — R599 Enlarged lymph nodes, unspecified: Secondary | ICD-10-CM | POA: Insufficient documentation

## 2014-10-22 DIAGNOSIS — J439 Emphysema, unspecified: Secondary | ICD-10-CM | POA: Insufficient documentation

## 2014-10-22 DIAGNOSIS — R918 Other nonspecific abnormal finding of lung field: Secondary | ICD-10-CM

## 2014-10-22 DIAGNOSIS — R05 Cough: Secondary | ICD-10-CM

## 2014-10-25 ENCOUNTER — Other Ambulatory Visit: Payer: Self-pay | Admitting: Specialist

## 2014-10-25 DIAGNOSIS — R599 Enlarged lymph nodes, unspecified: Secondary | ICD-10-CM

## 2014-10-25 DIAGNOSIS — R9389 Abnormal findings on diagnostic imaging of other specified body structures: Secondary | ICD-10-CM

## 2014-10-29 ENCOUNTER — Ambulatory Visit: Payer: Federal, State, Local not specified - PPO

## 2014-10-29 ENCOUNTER — Ambulatory Visit
Admission: RE | Admit: 2014-10-29 | Discharge: 2014-10-29 | Disposition: A | Discharge status: Home / Self Care | Payer: Medicare Other | Source: Ambulatory Visit | Attending: Specialist | Admitting: Specialist

## 2014-10-29 DIAGNOSIS — K5730 Diverticulosis of large intestine without perforation or abscess without bleeding: Secondary | ICD-10-CM | POA: Insufficient documentation

## 2014-10-29 DIAGNOSIS — R599 Enlarged lymph nodes, unspecified: Secondary | ICD-10-CM

## 2014-10-29 DIAGNOSIS — J439 Emphysema, unspecified: Secondary | ICD-10-CM | POA: Diagnosis not present

## 2014-10-29 DIAGNOSIS — D259 Leiomyoma of uterus, unspecified: Secondary | ICD-10-CM | POA: Insufficient documentation

## 2014-10-29 DIAGNOSIS — R9389 Abnormal findings on diagnostic imaging of other specified body structures: Secondary | ICD-10-CM

## 2014-10-29 DIAGNOSIS — I7090 Unspecified atherosclerosis: Secondary | ICD-10-CM | POA: Insufficient documentation

## 2014-10-29 LAB — GLUCOSE, CAPILLARY: GLUCOSE-CAPILLARY: 111 mg/dL — AB (ref 65–99)

## 2014-10-29 MED ORDER — FLUDEOXYGLUCOSE F - 18 (FDG) INJECTION
11.8500 | Freq: Once | INTRAVENOUS | Status: DC | PRN
  Administered 2014-10-29: 11.85 via INTRAVENOUS
  Filled 2014-10-29: qty 11.85

## 2014-11-04 ENCOUNTER — Other Ambulatory Visit: Payer: Self-pay | Admitting: Specialist

## 2014-11-04 DIAGNOSIS — R590 Localized enlarged lymph nodes: Secondary | ICD-10-CM

## 2014-11-05 ENCOUNTER — Ambulatory Visit: Payer: Federal, State, Local not specified - PPO

## 2014-11-09 ENCOUNTER — Encounter: Admission: RE | Payer: Self-pay | Source: Ambulatory Visit

## 2014-11-09 ENCOUNTER — Ambulatory Visit
Admission: RE | Admit: 2014-11-09 | Payer: Federal, State, Local not specified - PPO | Source: Ambulatory Visit | Admitting: Internal Medicine

## 2014-11-09 SURGERY — ENDOBRONCHIAL ULTRASOUND (EBUS)
Anesthesia: Choice

## 2014-12-16 ENCOUNTER — Ambulatory Visit (INDEPENDENT_AMBULATORY_CARE_PROVIDER_SITE_OTHER): Payer: Medicare Other

## 2014-12-16 ENCOUNTER — Other Ambulatory Visit: Payer: Self-pay | Admitting: Obstetrics and Gynecology

## 2014-12-16 ENCOUNTER — Ambulatory Visit
Admission: RE | Admit: 2014-12-16 | Discharge: 2014-12-16 | Disposition: A | Discharge status: Home / Self Care | Payer: Medicare Other | Source: Ambulatory Visit | Attending: Obstetrics and Gynecology | Admitting: Obstetrics and Gynecology

## 2014-12-16 DIAGNOSIS — Z1231 Encounter for screening mammogram for malignant neoplasm of breast: Secondary | ICD-10-CM | POA: Diagnosis not present

## 2014-12-16 DIAGNOSIS — Z23 Encounter for immunization: Secondary | ICD-10-CM

## 2015-01-24 ENCOUNTER — Ambulatory Visit: Payer: Federal, State, Local not specified - PPO

## 2015-02-03 ENCOUNTER — Ambulatory Visit
Admission: RE | Admit: 2015-02-03 | Discharge: 2015-02-03 | Disposition: A | Discharge status: Home / Self Care | Payer: Medicare Other | Source: Ambulatory Visit | Attending: Specialist | Admitting: Specialist

## 2015-02-03 ENCOUNTER — Other Ambulatory Visit: Payer: Self-pay

## 2015-02-03 ENCOUNTER — Ambulatory Visit: Admission: RE | Admit: 2015-02-03 | Payer: Medicare Other | Source: Ambulatory Visit

## 2015-02-03 ENCOUNTER — Inpatient Hospital Stay
Admission: EM | Admit: 2015-02-03 | Discharge: 2015-02-05 | DRG: 190 | Disposition: A | Discharge status: Home / Self Care | Payer: Medicare Other | Attending: Internal Medicine | Admitting: Internal Medicine

## 2015-02-03 DIAGNOSIS — I480 Paroxysmal atrial fibrillation: Secondary | ICD-10-CM | POA: Diagnosis present

## 2015-02-03 DIAGNOSIS — M1990 Unspecified osteoarthritis, unspecified site: Secondary | ICD-10-CM | POA: Diagnosis present

## 2015-02-03 DIAGNOSIS — Z955 Presence of coronary angioplasty implant and graft: Secondary | ICD-10-CM

## 2015-02-03 DIAGNOSIS — Z888 Allergy status to other drugs, medicaments and biological substances status: Secondary | ICD-10-CM

## 2015-02-03 DIAGNOSIS — Z9119 Patient's noncompliance with other medical treatment and regimen: Secondary | ICD-10-CM | POA: Diagnosis not present

## 2015-02-03 DIAGNOSIS — I10 Essential (primary) hypertension: Secondary | ICD-10-CM | POA: Diagnosis present

## 2015-02-03 DIAGNOSIS — I214 Non-ST elevation (NSTEMI) myocardial infarction: Secondary | ICD-10-CM | POA: Diagnosis not present

## 2015-02-03 DIAGNOSIS — Z833 Family history of diabetes mellitus: Secondary | ICD-10-CM

## 2015-02-03 DIAGNOSIS — E872 Acidosis: Secondary | ICD-10-CM | POA: Diagnosis present

## 2015-02-03 DIAGNOSIS — I270 Primary pulmonary hypertension: Secondary | ICD-10-CM | POA: Diagnosis present

## 2015-02-03 DIAGNOSIS — E119 Type 2 diabetes mellitus without complications: Secondary | ICD-10-CM | POA: Diagnosis present

## 2015-02-03 DIAGNOSIS — Z87891 Personal history of nicotine dependence: Secondary | ICD-10-CM | POA: Diagnosis not present

## 2015-02-03 DIAGNOSIS — Z807 Family history of other malignant neoplasms of lymphoid, hematopoietic and related tissues: Secondary | ICD-10-CM

## 2015-02-03 DIAGNOSIS — R55 Syncope and collapse: Secondary | ICD-10-CM | POA: Diagnosis present

## 2015-02-03 DIAGNOSIS — J9622 Acute and chronic respiratory failure with hypercapnia: Secondary | ICD-10-CM | POA: Diagnosis not present

## 2015-02-03 DIAGNOSIS — J209 Acute bronchitis, unspecified: Secondary | ICD-10-CM | POA: Diagnosis present

## 2015-02-03 DIAGNOSIS — Z7982 Long term (current) use of aspirin: Secondary | ICD-10-CM | POA: Diagnosis not present

## 2015-02-03 DIAGNOSIS — I272 Other secondary pulmonary hypertension: Secondary | ICD-10-CM | POA: Diagnosis not present

## 2015-02-03 DIAGNOSIS — Z8249 Family history of ischemic heart disease and other diseases of the circulatory system: Secondary | ICD-10-CM | POA: Diagnosis not present

## 2015-02-03 DIAGNOSIS — Z79899 Other long term (current) drug therapy: Secondary | ICD-10-CM

## 2015-02-03 DIAGNOSIS — I2510 Atherosclerotic heart disease of native coronary artery without angina pectoris: Secondary | ICD-10-CM | POA: Diagnosis present

## 2015-02-03 DIAGNOSIS — Z803 Family history of malignant neoplasm of breast: Secondary | ICD-10-CM

## 2015-02-03 DIAGNOSIS — I071 Rheumatic tricuspid insufficiency: Secondary | ICD-10-CM | POA: Diagnosis present

## 2015-02-03 DIAGNOSIS — J440 Chronic obstructive pulmonary disease with acute lower respiratory infection: Secondary | ICD-10-CM | POA: Diagnosis present

## 2015-02-03 DIAGNOSIS — R001 Bradycardia, unspecified: Secondary | ICD-10-CM | POA: Diagnosis not present

## 2015-02-03 DIAGNOSIS — J449 Chronic obstructive pulmonary disease, unspecified: Secondary | ICD-10-CM

## 2015-02-03 DIAGNOSIS — I5021 Acute systolic (congestive) heart failure: Secondary | ICD-10-CM | POA: Diagnosis not present

## 2015-02-03 DIAGNOSIS — J9621 Acute and chronic respiratory failure with hypoxia: Secondary | ICD-10-CM | POA: Diagnosis not present

## 2015-02-03 DIAGNOSIS — R7989 Other specified abnormal findings of blood chemistry: Secondary | ICD-10-CM

## 2015-02-03 DIAGNOSIS — R778 Other specified abnormalities of plasma proteins: Secondary | ICD-10-CM

## 2015-02-03 DIAGNOSIS — J439 Emphysema, unspecified: Secondary | ICD-10-CM | POA: Diagnosis not present

## 2015-02-03 DIAGNOSIS — F329 Major depressive disorder, single episode, unspecified: Secondary | ICD-10-CM | POA: Diagnosis present

## 2015-02-03 DIAGNOSIS — J441 Chronic obstructive pulmonary disease with (acute) exacerbation: Secondary | ICD-10-CM

## 2015-02-03 DIAGNOSIS — E7800 Pure hypercholesterolemia, unspecified: Secondary | ICD-10-CM | POA: Diagnosis present

## 2015-02-03 DIAGNOSIS — K219 Gastro-esophageal reflux disease without esophagitis: Secondary | ICD-10-CM | POA: Diagnosis present

## 2015-02-03 DIAGNOSIS — Z9981 Dependence on supplemental oxygen: Secondary | ICD-10-CM | POA: Diagnosis not present

## 2015-02-03 DIAGNOSIS — I959 Hypotension, unspecified: Secondary | ICD-10-CM | POA: Diagnosis not present

## 2015-02-03 DIAGNOSIS — R9431 Abnormal electrocardiogram [ECG] [EKG]: Secondary | ICD-10-CM

## 2015-02-03 DIAGNOSIS — M1000 Idiopathic gout, unspecified site: Secondary | ICD-10-CM | POA: Diagnosis present

## 2015-02-03 DIAGNOSIS — I5032 Chronic diastolic (congestive) heart failure: Secondary | ICD-10-CM | POA: Diagnosis present

## 2015-02-03 DIAGNOSIS — I469 Cardiac arrest, cause unspecified: Secondary | ICD-10-CM | POA: Diagnosis not present

## 2015-02-03 DIAGNOSIS — Z880 Allergy status to penicillin: Secondary | ICD-10-CM

## 2015-02-03 DIAGNOSIS — R590 Localized enlarged lymph nodes: Secondary | ICD-10-CM

## 2015-02-03 DIAGNOSIS — R0902 Hypoxemia: Secondary | ICD-10-CM

## 2015-02-03 HISTORY — DX: Pulmonary hypertension, unspecified: I27.20

## 2015-02-03 LAB — CBC WITH DIFFERENTIAL/PLATELET
Basophils Absolute: 0.1 10*3/uL (ref 0–0.1)
Eosinophils Absolute: 0.1 10*3/uL (ref 0–0.7)
Eosinophils Relative: 1 %
HEMATOCRIT: 37.5 % (ref 35.0–47.0)
HEMOGLOBIN: 11.2 g/dL — AB (ref 12.0–16.0)
LYMPHS ABS: 0.8 10*3/uL — AB (ref 1.0–3.6)
MCH: 21.6 pg — AB (ref 26.0–34.0)
MCHC: 29.9 g/dL — AB (ref 32.0–36.0)
MCV: 72.1 fL — AB (ref 80.0–100.0)
MONO ABS: 0.7 10*3/uL (ref 0.2–0.9)
NEUTROS ABS: 11.4 10*3/uL — AB (ref 1.4–6.5)
Neutrophils Relative %: 87 %
Platelets: 288 10*3/uL (ref 150–440)
RBC: 5.2 MIL/uL (ref 3.80–5.20)
RDW: 18.1 % — AB (ref 11.5–14.5)
WBC: 13 10*3/uL — ABNORMAL HIGH (ref 3.6–11.0)

## 2015-02-03 LAB — POCT I-STAT CREATININE: CREATININE: 1 mg/dL (ref 0.44–1.00)

## 2015-02-03 LAB — COMPREHENSIVE METABOLIC PANEL
ALBUMIN: 3.9 g/dL (ref 3.5–5.0)
ALT: 65 U/L — AB (ref 14–54)
AST: 54 U/L — AB (ref 15–41)
Alkaline Phosphatase: 81 U/L (ref 38–126)
Anion gap: 6 (ref 5–15)
BUN: 24 mg/dL — AB (ref 6–20)
CHLORIDE: 102 mmol/L (ref 101–111)
CO2: 30 mmol/L (ref 22–32)
CREATININE: 0.95 mg/dL (ref 0.44–1.00)
Calcium: 8.3 mg/dL — ABNORMAL LOW (ref 8.9–10.3)
GFR calc Af Amer: >60 mL/min (ref >60)
GFR, EST NON AFRICAN AMERICAN: 56 mL/min — AB (ref >60)
GLUCOSE: 155 mg/dL — AB (ref 65–99)
Potassium: 3.5 mmol/L (ref 3.5–5.1)
Sodium: 138 mmol/L (ref 135–145)
Total Bilirubin: 0.8 mg/dL (ref 0.3–1.2)
Total Protein: 7 g/dL (ref 6.5–8.1)

## 2015-02-03 LAB — TROPONIN I
TROPONIN I: 0.06 ng/mL — AB (ref <0.031)
Troponin I: 0.04 ng/mL — ABNORMAL HIGH (ref <0.031)

## 2015-02-03 LAB — GLUCOSE, CAPILLARY
GLUCOSE-CAPILLARY: 279 mg/dL — AB (ref 65–99)
Glucose-Capillary: 108 mg/dL — ABNORMAL HIGH (ref 65–99)

## 2015-02-03 LAB — BRAIN NATRIURETIC PEPTIDE: B Natriuretic Peptide: 1829 pg/mL — ABNORMAL HIGH (ref 0.0–100.0)

## 2015-02-03 MED ORDER — ENOXAPARIN SODIUM 40 MG/0.4ML ~~LOC~~ SOLN
40.0000 mg | SUBCUTANEOUS | Status: DC
  Administered 2015-02-03 – 2015-02-04 (×2): 40 mg via SUBCUTANEOUS
  Filled 2015-02-03 (×2): qty 0.4

## 2015-02-03 MED ORDER — IPRATROPIUM-ALBUTEROL 0.5-2.5 (3) MG/3ML IN SOLN
3.0000 mL | RESPIRATORY_TRACT | Status: DC
  Administered 2015-02-03 – 2015-02-05 (×10): 3 mL via RESPIRATORY_TRACT
  Filled 2015-02-03 (×10): qty 3

## 2015-02-03 MED ORDER — ACETAMINOPHEN 325 MG PO TABS
650.0000 mg | ORAL_TABLET | Freq: Four times a day (QID) | ORAL | Status: DC | PRN
  Administered 2015-02-03: 18:00:00 650 mg via ORAL
  Filled 2015-02-03: qty 2

## 2015-02-03 MED ORDER — LOSARTAN POTASSIUM 50 MG PO TABS
100.0000 mg | ORAL_TABLET | Freq: Every day | ORAL | Status: DC
  Administered 2015-02-04 – 2015-02-05 (×2): 100 mg via ORAL
  Filled 2015-02-03 (×2): qty 2

## 2015-02-03 MED ORDER — METHYLPREDNISOLONE SODIUM SUCC 125 MG IJ SOLR
125.0000 mg | Freq: Once | INTRAMUSCULAR | Status: AC
  Administered 2015-02-03: 125 mg via INTRAVENOUS
  Filled 2015-02-03: qty 2

## 2015-02-03 MED ORDER — LIDOCAINE 5 % EX PTCH
1.0000 | MEDICATED_PATCH | CUTANEOUS | Status: DC
  Filled 2015-02-03 (×2): qty 1

## 2015-02-03 MED ORDER — ONDANSETRON HCL 4 MG/2ML IJ SOLN: 4.0000 mg | Freq: Four times a day (QID) | INTRAMUSCULAR | Status: DC | PRN

## 2015-02-03 MED ORDER — ONDANSETRON HCL 4 MG PO TABS: 4.0000 mg | ORAL_TABLET | Freq: Four times a day (QID) | ORAL | Status: DC | PRN

## 2015-02-03 MED ORDER — LEVOFLOXACIN IN D5W 250 MG/50ML IV SOLN
250.0000 mg | INTRAVENOUS | Status: DC
  Administered 2015-02-04: 17:00:00 250 mg via INTRAVENOUS
  Filled 2015-02-03 (×2): qty 50

## 2015-02-03 MED ORDER — LEVOFLOXACIN IN D5W 500 MG/100ML IV SOLN
500.0000 mg | Freq: Once | INTRAVENOUS | Status: AC
  Administered 2015-02-03: 500 mg via INTRAVENOUS
  Filled 2015-02-03: qty 100

## 2015-02-03 MED ORDER — IPRATROPIUM-ALBUTEROL 0.5-2.5 (3) MG/3ML IN SOLN
3.0000 mL | Freq: Once | RESPIRATORY_TRACT | Status: AC
  Administered 2015-02-03: 3 mL via RESPIRATORY_TRACT
  Filled 2015-02-03: qty 3

## 2015-02-03 MED ORDER — DOCUSATE SODIUM 100 MG PO CAPS
100.0000 mg | ORAL_CAPSULE | Freq: Two times a day (BID) | ORAL | Status: DC
  Administered 2015-02-03 – 2015-02-05 (×4): 100 mg via ORAL
  Filled 2015-02-03 (×4): qty 1

## 2015-02-03 MED ORDER — PREDNISOLONE ACETATE 1 % OP SUSP
1.0000 [drp] | Freq: Four times a day (QID) | OPHTHALMIC | Status: DC
  Administered 2015-02-03 – 2015-02-05 (×5): 1 [drp] via OPHTHALMIC
  Filled 2015-02-03: qty 1

## 2015-02-03 MED ORDER — POLYETHYLENE GLYCOL 3350 17 G PO PACK: 17.0000 g | PACK | Freq: Every day | ORAL | Status: DC | PRN

## 2015-02-03 MED ORDER — PANTOPRAZOLE SODIUM 40 MG PO TBEC
40.0000 mg | DELAYED_RELEASE_TABLET | Freq: Every day | ORAL | Status: DC
  Administered 2015-02-04 – 2015-02-05 (×2): 40 mg via ORAL
  Filled 2015-02-03 (×2): qty 1

## 2015-02-03 MED ORDER — MIRTAZAPINE 15 MG PO TABS
15.0000 mg | ORAL_TABLET | Freq: Every day | ORAL | Status: DC
  Administered 2015-02-03 – 2015-02-04 (×2): 15 mg via ORAL
  Filled 2015-02-03 (×2): qty 1

## 2015-02-03 MED ORDER — OXYCODONE HCL 5 MG PO TABS
5.0000 mg | ORAL_TABLET | ORAL | Status: DC | PRN
  Administered 2015-02-04: 5 mg via ORAL
  Filled 2015-02-03: qty 1

## 2015-02-03 MED ORDER — AMLODIPINE BESYLATE 5 MG PO TABS
5.0000 mg | ORAL_TABLET | Freq: Every day | ORAL | Status: DC
  Administered 2015-02-04 – 2015-02-05 (×2): 5 mg via ORAL
  Filled 2015-02-03 (×2): qty 1

## 2015-02-03 MED ORDER — INSULIN ASPART 100 UNIT/ML ~~LOC~~ SOLN
0.0000 [IU] | Freq: Every day | SUBCUTANEOUS | Status: DC
  Administered 2015-02-03: 3 [IU] via SUBCUTANEOUS
  Filled 2015-02-03: qty 3

## 2015-02-03 MED ORDER — METHYLPREDNISOLONE SODIUM SUCC 125 MG IJ SOLR
60.0000 mg | Freq: Three times a day (TID) | INTRAMUSCULAR | Status: DC
  Administered 2015-02-03: 60 mg via INTRAVENOUS
  Administered 2015-02-04: 22:00:00 125 mg via INTRAVENOUS
  Administered 2015-02-04 (×2): 60 mg via INTRAVENOUS
  Administered 2015-02-05: 06:00:00 125 mg via INTRAVENOUS
  Filled 2015-02-03 (×5): qty 2

## 2015-02-03 MED ORDER — INSULIN ASPART 100 UNIT/ML ~~LOC~~ SOLN
0.0000 [IU] | Freq: Three times a day (TID) | SUBCUTANEOUS | Status: DC
  Administered 2015-02-04: 3 [IU] via SUBCUTANEOUS
  Administered 2015-02-04: 09:00:00 2 [IU] via SUBCUTANEOUS
  Administered 2015-02-04: 17:00:00 3 [IU] via SUBCUTANEOUS
  Administered 2015-02-05: 08:00:00 2 [IU] via SUBCUTANEOUS
  Filled 2015-02-03 (×2): qty 3
  Filled 2015-02-03 (×3): qty 2

## 2015-02-03 MED ORDER — ALLOPURINOL 100 MG PO TABS
100.0000 mg | ORAL_TABLET | Freq: Every day | ORAL | Status: DC
  Administered 2015-02-04 – 2015-02-05 (×2): 100 mg via ORAL
  Filled 2015-02-03 (×2): qty 1

## 2015-02-03 MED ORDER — HYDRALAZINE HCL 20 MG/ML IJ SOLN
10.0000 mg | INTRAMUSCULAR | Status: AC
  Administered 2015-02-03: 10 mg via INTRAVENOUS
  Filled 2015-02-03: qty 1

## 2015-02-03 MED ORDER — IOHEXOL 300 MG/ML  SOLN
75.0000 mL | Freq: Once | INTRAMUSCULAR | Status: AC | PRN
  Administered 2015-02-03: 75 mL via INTRAVENOUS

## 2015-02-03 MED ORDER — MOMETASONE FURO-FORMOTEROL FUM 100-5 MCG/ACT IN AERO
2.0000 | INHALATION_SPRAY | Freq: Two times a day (BID) | RESPIRATORY_TRACT | Status: DC
  Administered 2015-02-03 – 2015-02-05 (×4): 2 via RESPIRATORY_TRACT
  Filled 2015-02-03: qty 8.8

## 2015-02-03 MED ORDER — DIGOXIN 125 MCG PO TABS
125.0000 ug | ORAL_TABLET | Freq: Every day | ORAL | Status: DC
  Administered 2015-02-04 – 2015-02-05 (×2): 125 ug via ORAL
  Filled 2015-02-03 (×2): qty 1

## 2015-02-03 MED ORDER — METOPROLOL SUCCINATE ER 50 MG PO TB24
50.0000 mg | ORAL_TABLET | Freq: Every day | ORAL | Status: DC
  Administered 2015-02-04 – 2015-02-05 (×2): 50 mg via ORAL
  Filled 2015-02-03 (×2): qty 1

## 2015-02-03 MED ORDER — ASPIRIN EC 81 MG PO TBEC
81.0000 mg | DELAYED_RELEASE_TABLET | Freq: Every day | ORAL | Status: DC
  Administered 2015-02-04 – 2015-02-05 (×2): 81 mg via ORAL
  Filled 2015-02-03 (×2): qty 1

## 2015-02-03 MED ORDER — ACETAMINOPHEN 650 MG RE SUPP
650.0000 mg | Freq: Four times a day (QID) | RECTAL | Status: DC | PRN
Start: 2015-02-03 — End: 2015-02-05

## 2015-02-03 NOTE — ED Notes (Signed)
Pt c/o increased SOB for the past 3-4 days. Pt is tachypneic with pursed lip breathing.. Pt is on continuous 2L [place] at home.Marland Kitchen

## 2015-02-03 NOTE — ED Provider Notes (Signed)
[facility] Emergency Department Provider Note  ____________________________________________  Time seen: Approximately 4:13 PM  I have reviewed the triage vital signs and the nursing notes.   HISTORY  Chief Complaint Shortness of Breath  HPI Allison Francis is a 79 y.o. female who came in for repeat CT scan to check on a lymph node that look swollen 6 months ago on the CT scan. She then was short of breath with sats in the 70s so came to the emergency room. Patient reports she's been getting progressively short of breath for some time. She has a cough which is productive of white phlegm during the day and brown phlegm when she gets up in the morning. She is not running a fever. She occasionally gets chest tightness when she walks and her oxygen level gets low. She is on home O2 but usually only wears it at night and for breakfast lunch and dinner not while she is walking around the house. She no she's been told to wear while she is walking in the house. He sees Dr. call would for cardiology and Dr. Raul Del for her lungs. Patient here laying in bed comfortably sat of 93 however since she gets up and is discharged to move her sats dropped down to 77. After several minutes laying back in the bed on 2 L and I should add that she was on 2 L when she was moving around. Her sats stayed down the lower range of 86. So we've bumped her up to 4 L. I will put her in for admission for COPD exacerbation patient at this point has had nebs without any relief. She does not have any wheezing   Past Medical History  Diagnosis Date  . Type II or unspecified type diabetes mellitus without mention of complication, not stated as uncontrolled   . Chronic airway obstruction, not elsewhere classified   . Congestive heart failure, unspecified   . Pure hypercholesterolemia   . Essential hypertension, benign   . Gouty arthropathy   . Coronary atherosclerosis of unspecified type of vessel, native or  graft 2011  . Arthritis   . Hernia 2011  . Hypertension 1966  . Personal history of tobacco use, presenting hazards to health   . Breast screening, unspecified   . Mammographic microcalcification 2013  . Special screening for malignant neoplasms, colon   . Bowel trouble 1968  . Bronchiolitis 2014    Patient Active Problem List   Diagnosis Date Noted  . Chronic iritis of both eyes 08/17/2014  . Osteoporosis 08/17/2014  . Insomnia 08/17/2014  . IBS (irritable bowel syndrome) 08/17/2014  . Lump or mass in breast 11/04/2012  . Acid reflux 08/14/2012  . Diet-controlled diabetes mellitus ([place]) 08/14/2012  . Cardiomyopathy ([place]) 08/13/2012  . A-fib ([place]) 08/13/2012  . Controlled gout 08/13/2012  . CAD (coronary artery disease) 03/27/2011  . Smoking history 03/27/2011  . COPD, severe ([place]) 03/27/2011  . Systolic CHF ([place]) 32/03/3341  . HTN (hypertension) 03/27/2011    Past Surgical History  Procedure Laterality Date  . Cardiac catheterization  01-02-00    cardiac stent, RCA  . Hernia repair  2011  . Coronary angioplasty with stent placement  2011  . Colonoscopy  2005  . Breast biopsy Right     neg    Current Outpatient Rx  Name  Route  Sig  Dispense  Refill  . allopurinol (ZYLOPRIM) 100 MG tablet   Oral   Take 1 tablet (100 mg total) by  mouth daily.   30 tablet   5   . amLODipine (NORVASC) 5 MG tablet   Oral   Take 1 tablet (5 mg total) by mouth daily.   30 tablet   5   . aspirin EC 81 MG tablet   Oral   Take 81 mg by mouth daily.         . digoxin (LANOXIN) 0.125 MG tablet   Oral   Take 1 tablet by mouth daily.      2   . Fluticasone-Salmeterol (ADVAIR DISKUS) 250-50 MCG/DOSE AEPB   Inhalation   Inhale 1 puff into the lungs every 12 (twelve) hours.         Marland Kitchen HYDROcodone-homatropine (HYCODAN) 5-1.5 MG/5ML syrup   Oral   Take by mouth as needed.         . lidocaine (LIDODERM) 5 %   Topical   Apply 1 patch topically daily. Remove & Discard patch  within 12 hours or as directed by MD         . losartan (COZAAR) 100 MG tablet   Oral   Take 1 tablet (100 mg total) by mouth daily.   30 tablet   5   . metoprolol succinate (TOPROL-XL) 50 MG 24 hr tablet   Oral   Take 1 tablet (50 mg total) by mouth daily. Take with or immediately following a meal.   30 tablet   5   . mirtazapine (REMERON) 15 MG tablet      TAKE 1 TABLET BY MOUTH IN THE EVENING FOR APPETITE   90 tablet   1   . omeprazole (PRILOSEC) 20 MG capsule   Oral   Take 1 capsule (20 mg total) by mouth daily.   30 capsule   5   . prednisoLONE acetate (PRED FORTE) 1 % ophthalmic suspension      PLACE 1 DROP INTO BOTH EYES EVERY DAY AS NEEDED      3     Allergies Metformin hcl and Penicillins  Family History  Problem Relation Age of Onset  . Multiple myeloma Mother   . Heart attack Mother   . Breast cancer Mother 2  . Diabetes Mother   . Hypertension      fx    Social History Social History  Substance Use Topics  . Smoking status: Former Smoker -- 1.00 packs/day for 35 years    Types: Cigarettes    Quit date: 02/06/1996  . Smokeless tobacco: Never Used  . Alcohol Use: 3.6 oz/week    6 Standard drinks or equivalent per week    Review of Systems Constitutional: No fever/chills Eyes: No visual changes. ENT: No sore throat. Cardiovascular see history of present illness Respiratory: See history of present illness Gastrointestinal: No abdominal pain.  No nausea, no vomiting.  No diarrhea.  No constipation. Genitourinary: Negative for dysuria. Musculoskeletal: Negative for back pain. Skin: Negative for rash. Neurological: Negative for headaches, focal weakness or numbness.  10-point ROS otherwise negative.  ____________________________________________   PHYSICAL EXAM:  VITAL SIGNS: ED Triage Vitals  Enc Vitals Group     BP 02/03/15 1411 155/74 mmHg     Pulse Rate 02/03/15 1411 69     Resp 02/03/15 1411 40     Temp 02/03/15 1411 98.1  F (36.7 C)     Temp Source 02/03/15 1411 Oral     SpO2 02/03/15 1411 78 %     Weight 02/03/15 1411 95 lb (43.092 kg)  Height 02/03/15 1411 5' (1.524 m)     Head Cir --      Peak Flow --      Pain Score --      Pain Loc --      Pain Edu? --      Excl. in [place]? --     Constitutional: Alert and oriented. Well appearing and in no acute distress. Eyes: Conjunctivae are normal. PERRL. EOMI. Head: Atraumatic. Nose: No congestion/rhinnorhea. Mouth/Throat: Mucous membranes are moist.  Oropharynx non-erythematous. Neck: No stridor.  {** Cardiovascular: Normal rate, regular rhythm. Grossly normal heart sounds.  Good peripheral circulation. Respiratory: Normal respiratory effort.  No retractions. Lungs clear except for slight crackles worse on the right only in the bases Gastrointestinal: Soft and nontender. No distention. No abdominal bruits. No CVA tenderness. Musculoskeletal: No lower extremity tenderness nor edema.  No joint effusions. Neurologic:  Normal speech and language. No gross focal neurologic deficits are appreciated. No gait instability. Skin:  Skin is warm, dry and intact. No rash noted. Psychiatric: Mood and affect are normal. Speech and behavior are normal.  ____________________________________________   LABS (all labs ordered are listed, but only abnormal results are displayed)  Labs Reviewed  COMPREHENSIVE METABOLIC PANEL - Abnormal; Notable for the following:    Glucose, Bld 155 (*)    BUN 24 (*)    Calcium 8.3 (*)    AST 54 (*)    ALT 65 (*)    GFR calc non Af Amer 56 (*)    All other components within normal limits  TROPONIN I - Abnormal; Notable for the following:    Troponin I 0.06 (*)    All other components within normal limits  BRAIN NATRIURETIC PEPTIDE - Abnormal; Notable for the following:    B Natriuretic Peptide 1829.0 (*)    All other components within normal limits  CBC WITH DIFFERENTIAL/PLATELET - Abnormal; Notable for the following:    WBC  13.0 (*)    Hemoglobin 11.2 (*)    MCV 72.1 (*)    MCH 21.6 (*)    MCHC 29.9 (*)    RDW 18.1 (*)    Neutro Abs 11.4 (*)    Lymphs Abs 0.8 (*)    All other components within normal limits  TROPONIN I   ____________________________________________  EKG  EKG normal sinus rhythm normal axis there are flipped T's inferiorly and in V4 through 6. ____________________________________________  RADIOLOGY  CT was read by radiology no PE or infiltrate was seen emphysema was seen ____________________________________________   PROCEDURES    ____________________________________________   INITIAL IMPRESSION / ASSESSMENT AND PLAN / ED COURSE  Pertinent labs & imaging results that were available during my care of the patient were reviewed by me and considered in my medical decision making (see chart for details).   ____________________________________________   FINAL CLINICAL IMPRESSION(S) / ED DIAGNOSES  Final diagnoses:  COPD exacerbation ([place])  Hypoxia  Elevated troponin  Abnormal EKG      Nena Polio, MD 02/03/15 1621

## 2015-02-03 NOTE — H&P (Signed)
[facility]    MR#:  [id number]  DATE OF BIRTH:  1935-03-23  DATE OF ADMISSION:  02/03/2015  PRIMARY CARE PHYSICIAN: Loistine Chance, MD   REQUESTING/REFERRING PHYSICIAN: Dr. Conni Slipper  CHIEF COMPLAINT:   Chief Complaint  Patient presents with  . Shortness of Breath    HISTORY OF PRESENT ILLNESS:  Allison Francis  is a 79 y.o. female with a known history of type 2 diabetes mellitus, COPD on 2 L home oxygen, diastolic CHF, pulmonary hypertension, prediabetes, hypertension and arthritis presents to the hospital secondary to worsening shortness of breath and hypoxia.. Patient follows up with Dr. Vella Kohler as an outpatient. He has been having worsening shortness of breath for a couple of weeks now, gradually getting worse over the last 3 days. Being very fatigued and tired, unable to rest better at nighttime. Her breathing even at rest got worse. At baseline she usually lives independently, still continues to drive. Her cough has been getting worse, she complains of some chills but no fevers nausea or vomiting. Today she was scheduled to have a follow-up CT of her chest after which she became very tachypneic, dyspneic and presented to the emergency room. CT of her chest revealing diffuse bronchial thickening with severe emphysema and bullous changes. No pneumonia or congestive heart failure changes noted. Patient remains hypoxic on HER-2 liters oxygen. Sats drop into the 70s. She is on 4 L oxygen now. She is being admitted for COPD exacerbation.  PAST MEDICAL HISTORY:   Past Medical History  Diagnosis Date  . Type II or unspecified type diabetes mellitus without mention of complication, not stated as uncontrolled   . Chronic airway obstruction, not elsewhere classified     on 2L o2 at home  . Congestive heart failure, unspecified   . Pure hypercholesterolemia   . Essential hypertension, benign   . Gouty  arthropathy   . Coronary atherosclerosis of unspecified type of vessel, native or graft 2011  . Arthritis   . Hernia 2011  . Hypertension 1966  . Personal history of tobacco use, presenting hazards to health   . Breast screening, unspecified   . Mammographic microcalcification 2013  . Special screening for malignant neoplasms, colon   . Bowel trouble 1968  . Bronchiolitis 2014  . Pulmonary hypertension ([place])     PAST SURGICAL HISTORY:   Past Surgical History  Procedure Laterality Date  . Cardiac catheterization  01-02-00    cardiac stent, RCA  . Hernia repair  2011  . Coronary angioplasty with stent placement  2011  . Colonoscopy  2005  . Breast biopsy Right     neg    SOCIAL HISTORY:   Social History  Substance Use Topics  . Smoking status: Former Smoker -- 1.00 packs/day for 35 years    Types: Cigarettes    Quit date: 02/06/1996  . Smokeless tobacco: Never Used     Comment: quit 25 years ago  . Alcohol Use: 3.6 oz/week    6 Standard drinks or equivalent per week     Comment: occasional wine    FAMILY HISTORY:   Family History  Problem Relation Age of Onset  . Multiple myeloma Mother   . Heart attack Mother   . Breast cancer Mother 83  . Diabetes Mother   . Hypertension      fx    DRUG ALLERGIES:   Allergies  Allergen Reactions  . Metformin Hcl  Diarrhea  . Penicillins Hives    REVIEW OF SYSTEMS:   Review of Systems  Constitutional: Positive for malaise/fatigue. Negative for fever, chills and weight loss.  HENT: Negative for ear discharge, ear pain, nosebleeds and tinnitus.   Eyes: Negative for blurred vision, double vision and photophobia.  Respiratory: Positive for cough, shortness of breath and wheezing. Negative for hemoptysis.   Cardiovascular: Positive for orthopnea. Negative for chest pain, palpitations and leg swelling.  Gastrointestinal: Negative for heartburn, nausea, vomiting, abdominal pain, diarrhea, constipation and melena.   Genitourinary: Negative for dysuria, frequency and hematuria.  Musculoskeletal: Negative for myalgias, back pain and neck pain.  Skin: Negative for rash.  Neurological: Negative for dizziness, tremors, sensory change, speech change, focal weakness and headaches.  Endo/Heme/Allergies: Does not bruise/bleed easily.  Psychiatric/Behavioral: Negative for depression.    MEDICATIONS AT HOME:   Prior to Admission medications   Medication Sig Start Date End Date Taking? Authorizing Provider  allopurinol (ZYLOPRIM) 100 MG tablet Take 1 tablet (100 mg total) by mouth daily. 08/17/14   Steele Sizer, MD  amLODipine (NORVASC) 5 MG tablet Take 1 tablet (5 mg total) by mouth daily. 08/17/14   Steele Sizer, MD  aspirin EC 81 MG tablet Take 81 mg by mouth daily.    Historical Provider, MD  digoxin (LANOXIN) 0.125 MG tablet Take 1 tablet by mouth daily. 08/11/14   Historical Provider, MD  Fluticasone-Salmeterol (ADVAIR DISKUS) 250-50 MCG/DOSE AEPB Inhale 1 puff into the lungs every 12 (twelve) hours.    Historical Provider, MD  HYDROcodone-homatropine [facility]) 5-1.5 MG/5ML syrup Take by mouth as needed. 04/12/14   Historical Provider, MD  lidocaine (LIDODERM) 5 % Apply 1 patch topically daily. Remove & Discard patch within 12 hours or as directed by MD    Historical Provider, MD  losartan (COZAAR) 100 MG tablet Take 1 tablet (100 mg total) by mouth daily. 08/17/14   Steele Sizer, MD  metoprolol succinate (TOPROL-XL) 50 MG 24 hr tablet Take 1 tablet (50 mg total) by mouth daily. Take with or immediately following a meal. 08/17/14   Steele Sizer, MD  mirtazapine (REMERON) 15 MG tablet TAKE 1 TABLET BY MOUTH IN THE EVENING FOR APPETITE 10/14/14   Steele Sizer, MD  omeprazole (PRILOSEC) 20 MG capsule Take 1 capsule (20 mg total) by mouth daily. 08/17/14   Steele Sizer, MD  prednisoLONE acetate (PRED FORTE) 1 % ophthalmic suspension PLACE 1 DROP INTO BOTH EYES EVERY DAY AS NEEDED 07/25/14   Historical Provider,  MD      VITAL SIGNS:  Blood pressure 178/80, pulse 67, temperature 98.1 F (36.7 C), temperature source Oral, resp. rate 16, height 5' (1.524 m), weight 43.092 kg (95 lb), last menstrual period 02/05/1989, SpO2 98 %.  PHYSICAL EXAMINATION:   Physical Exam  GENERAL:  79 y.o.-year-old thin patient lying in the bed and in mild distress due to dyspnea.Marland Kitchen  EYES: Pupils equal, post-surgical, round, reactive to light and accommodation. No scleral icterus. Extraocular muscles intact.  HEENT: Head atraumatic, normocephalic. Oropharynx and nasopharynx clear.  NECK:  Supple, no jugular venous distention. No thyroid enlargement, no tenderness.  LUNGS: scant breath sounds bilaterally, occasional scattered wheezing noted,  no rales,rhonchi or crepitation. No use of accessory muscles of respiration.  CARDIOVASCULAR: S1, S2 normal. No rubs, or gallops. 3/6 systolic murmur present. ABDOMEN: Soft, nontender, nondistended. Bowel sounds present. No organomegaly or mass.  EXTREMITIES: No pedal edema, cyanosis, or clubbing.  NEUROLOGIC: Cranial nerves II through XII are intact. Muscle strength 5/5 in  all extremities. Sensation intact. Gait not checked.  PSYCHIATRIC: The patient is alert and oriented x 3.  SKIN: No obvious rash, lesion, or ulcer.   LABORATORY PANEL:   CBC  Recent Labs Lab 02/03/15 1422  WBC 13.0*  HGB 11.2*  HCT 37.5  PLT 288   ------------------------------------------------------------------------------------------------------------------  Chemistries   Recent Labs Lab 02/03/15 1422  NA 138  K 3.5  CL 102  CO2 30  GLUCOSE 155*  BUN 24*  CREATININE 0.95  CALCIUM 8.3*  AST 54*  ALT 65*  ALKPHOS 81  BILITOT 0.8   ------------------------------------------------------------------------------------------------------------------  Cardiac Enzymes  Recent Labs Lab 02/03/15 1422  TROPONINI 0.06*    ------------------------------------------------------------------------------------------------------------------  RADIOLOGY:  Ct Chest W Contrast  02/03/2015  CLINICAL DATA:  79 year old female with history of cough and COPD. Cough for the past 6-7 months. Prior smoker for 30 years, quit 20 years ago. EXAM: CT CHEST WITH CONTRAST TECHNIQUE: Multidetector CT imaging of the chest was performed during intravenous contrast administration. CONTRAST:  73m OMNIPAQUE IOHEXOL 300 MG/ML  SOLN COMPARISON:  PET-CT 10/29/2014. FINDINGS: Mediastinum/Lymph Nodes: Heart size is enlarged with right ventricular and right atrial dilatation. There is no significant pericardial fluid, thickening or pericardial calcification. There is atherosclerosis of the thoracic aorta, the great vessels of the mediastinum and the coronary arteries, including calcified atherosclerotic plaque in the left main, left anterior descending, left circumflex and right coronary arteries. Pulmonic trunk is mildly dilated measuring 3.1 cm in diameter. Multiple borderline enlarged and mildly enlarged mediastinal lymph nodes, similar to prior examinations, measuring up to 13 mm in short axis in the low right paratracheal nodal station. Small hiatal hernia. No axillary lymphadenopathy. Lungs/Pleura: Mild diffuse bronchial wall thickening with moderate to severe centrilobular and mild paraseptal emphysema, with extensive bullous changes throughout the right middle lobe and lingula. No acute consolidative airspace disease. Trace right pleural effusion layering dependently. No suspicious appearing pulmonary nodules or masses. Linear areas of scarring are noted in the mid lungs bilaterally. Upper Abdomen: Atherosclerosis. Musculoskeletal/Soft Tissues: There are no aggressive appearing lytic or blastic lesions noted in the visualized portions of the skeleton. IMPRESSION: 1. No acute findings. 2. Diffuse bronchial thickening with moderate to severe  centrilobular and mild paraseptal emphysema, including advanced bullous changes in the right middle lobe and lingula; imaging findings compatible with the reported clinical history of COPD. 3. Atherosclerosis, including left main and 3 vessel coronary artery disease. Assessment for potential risk factor modification, dietary therapy or pharmacologic therapy may be warranted, if clinically indicated. 4. Cardiomegaly with right ventricular and right atrial dilatation. 5. Mild dilatation of the pulmonic trunk (3.1 cm in diameter), suggestive of pulmonary arterial hypertension. Electronically Signed   By: DVinnie LangtonM.D.   On: 02/03/2015 15:08    EKG:   Orders placed or performed during the hospital encounter of 02/03/15  . ED EKG  . ED EKG  . ED EKG  . ED EKG    IMPRESSION AND PLAN:   Allison Francis is a 79y.o. female with a known history of type 2 diabetes mellitus, COPD on 2 L home oxygen, diastolic CHF, pulmonary hypertension, prediabetes, hypertension and arthritis presents to the hospital secondary to worsening shortness of breath and hypoxia..  #1 acute on chronic COPD exacerbation with acute bronchitis-changes noted on the CT is supple. No pneumonia or CHF noted. -Started on IV steroids, DuoNeb nebs, continue her inhalers. - pulmonary consult with Dr. FVella Kohler- Levaquin for her bronchitis. -Continue oxygen support. Currently requiring 4  L oxygen.  #2 hypertension-malignant hypertension. Give 1 dose of IV hydralazine stat. And restart her home medications. -On metoprolol, norvasc and losartan.  #3 history of atrial fibrillation-rate controlled at this time. In normal sinus rhythm. -Continue her digoxin. Also on metoprolol. -Not on any anticoagulation.  #4 depression-continue Remeron.  #5 DVT prophylaxis-on Lovenox  # 6 elevated troponin-likely demand ischemia from COPD. Monitor troponins. Check echocardiogram.    All the records are reviewed and case discussed with ED  provider. Management plans discussed with the patient, family and they are in agreement.  CODE STATUS: Full code  TOTAL TIME TAKING CARE OF THIS PATIENT: 50 minutes.    Gladstone Lighter M.D on 02/03/2015 at 4:32 PM  Between 7am to 6pm - Pager - [phone]  After 6pm go to [email or web address] - password [email or web address] [facility]  Office  [phone]  CC: Primary care physician; Loistine Chance, MD

## 2015-02-03 NOTE — Progress Notes (Signed)
ANTIBIOTIC CONSULT NOTE - INITIAL  Pharmacy Consult for Levaquin  Indication: COPD exacerbation  Allergies  Allergen Reactions  . Metformin Hcl Diarrhea  . Penicillins Hives    Patient Measurements: Height: 5' (152.4 cm) Weight: 99 lb (44.906 kg) IBW/kg (Calculated) : 45.5 Adjusted Body Weight:   Vital Signs: Temp: 98 F (36.7 C) (12/29 1754) Temp Source: Oral (12/29 1754) BP: 163/56 mmHg (12/29 1754) Pulse Rate: 81 (12/29 1754) Intake/Output from previous day:   Intake/Output from this shift:    Labs:  Recent Labs  02/03/15 1308 02/03/15 1422  WBC  --  13.0*  HGB  --  11.2*  PLT  --  288  CREATININE 1.00 0.95   Estimated Creatinine Clearance: 35.2 mL/min (by C-G formula based on Cr of 0.95). No results for input(s): VANCOTROUGH, VANCOPEAK, VANCORANDOM, GENTTROUGH, GENTPEAK, GENTRANDOM, TOBRATROUGH, TOBRAPEAK, TOBRARND, AMIKACINPEAK, AMIKACINTROU, AMIKACIN in the last 72 hours.   Microbiology: No results found for this or any previous visit (from the past 720 hour(s)).  Medical History: Past Medical History  Diagnosis Date  . Type II or unspecified type diabetes mellitus without mention of complication, not stated as uncontrolled   . Chronic airway obstruction, not elsewhere classified     on 2L o2 at home  . Congestive heart failure, unspecified   . Pure hypercholesterolemia   . Essential hypertension, benign   . Gouty arthropathy   . Coronary atherosclerosis of unspecified type of vessel, native or graft 2011  . Arthritis   . Hernia 2011  . Hypertension 1966  . Personal history of tobacco use, presenting hazards to health   . Breast screening, unspecified   . Mammographic microcalcification 2013  . Special screening for malignant neoplasms, colon   . Bowel trouble 1968  . Bronchiolitis 2014  . Pulmonary hypertension ([facility])     Medications:  Prescriptions prior to admission  Medication Sig Dispense Refill Last Dose  . albuterol (PROAIR HFA) 108  (90 Base) MCG/ACT inhaler Inhale 2 puffs into the lungs every 6 (six) hours as needed.   prn  . allopurinol (ZYLOPRIM) 100 MG tablet Take 1 tablet (100 mg total) by mouth daily. (Patient taking differently: Take 100 mg by mouth as needed. ) 30 tablet 5 prn at Unknown time  . amLODipine (NORVASC) 5 MG tablet Take 1 tablet (5 mg total) by mouth daily. 30 tablet 5 02/03/2015 at Unknown time  . aspirin EC 81 MG tablet Take 81 mg by mouth daily.   02/03/2015 at Unknown time  . benzonatate (TESSALON) 100 MG capsule Take 100 mg by mouth 3 (three) times daily as needed for cough.   prn  . digoxin (LANOXIN) 0.125 MG tablet Take 1 tablet by mouth daily.  2 02/03/2015 at Unknown time  . Fluticasone-Salmeterol (ADVAIR DISKUS) 250-50 MCG/DOSE AEPB Inhale 1 puff into the lungs every 12 (twelve) hours.   02/03/2015 at Unknown time  . HYDROcodone-homatropine (HYCODAN) 5-1.5 MG/5ML syrup Take by mouth as needed.   prn  . lidocaine (LIDODERM) 5 % Apply 1 patch topically daily as needed. Remove & Discard patch within 12 hours or as directed by MD   prn  . losartan (COZAAR) 100 MG tablet Take 1 tablet (100 mg total) by mouth daily. 30 tablet 5 02/03/2015 at Unknown time  . metoprolol succinate (TOPROL-XL) 50 MG 24 hr tablet Take 1 tablet (50 mg total) by mouth daily. Take with or immediately following a meal. 30 tablet 5 02/03/2015 at Unknown time  . mirtazapine (REMERON) 15 MG  tablet TAKE 1 TABLET BY MOUTH IN THE EVENING FOR APPETITE 90 tablet 1 02/02/2015 at Unknown time  . omeprazole (PRILOSEC) 20 MG capsule Take 1 capsule (20 mg total) by mouth daily. 30 capsule 5 02/03/2015 at Unknown time  . prednisoLONE acetate (PRED FORTE) 1 % ophthalmic suspension PLACE 1 DROP INTO BOTH EYES EVERY DAY AS NEEDED  3 02/03/2015 at Unknown time   Assessment: CrCl = 35.2 ml/min   Goal of Therapy:  resolution of infection   Plan:  Expected duration 7 days with resolution of temperature and/or normalization of WBC   Levaquin  500 mg IV Q24H originally ordered.  Will adjust dose to  Levaquin 500 mg IV X 1 to be given on 12/29 followed by  Levaquin 250 mg IV Q24H ordered to start 12/30 .   Persais Ethridge D 02/03/2015,6:12 PM

## 2015-02-03 NOTE — ED Notes (Signed)
Troponin due around 1720

## 2015-02-04 ENCOUNTER — Inpatient Hospital Stay: Payer: Medicare Other

## 2015-02-04 ENCOUNTER — Inpatient Hospital Stay (HOSPITAL_COMMUNITY)
Admit: 2015-02-04 | Discharge: 2015-02-04 | Disposition: A | Discharge status: Home / Self Care | Payer: Medicare Other | Attending: Internal Medicine | Admitting: Internal Medicine

## 2015-02-04 DIAGNOSIS — I5021 Acute systolic (congestive) heart failure: Secondary | ICD-10-CM

## 2015-02-04 DIAGNOSIS — J441 Chronic obstructive pulmonary disease with (acute) exacerbation: Secondary | ICD-10-CM

## 2015-02-04 DIAGNOSIS — J439 Emphysema, unspecified: Secondary | ICD-10-CM

## 2015-02-04 DIAGNOSIS — I272 Other secondary pulmonary hypertension: Secondary | ICD-10-CM

## 2015-02-04 LAB — CBC
HEMATOCRIT: 34.7 % — AB (ref 35.0–47.0)
HEMOGLOBIN: 10.8 g/dL — AB (ref 12.0–16.0)
MCH: 21.9 pg — AB (ref 26.0–34.0)
MCHC: 31.1 g/dL — AB (ref 32.0–36.0)
MCV: 70.6 fL — AB (ref 80.0–100.0)
Platelets: 267 10*3/uL (ref 150–440)
RBC: 4.91 MIL/uL (ref 3.80–5.20)
RDW: 17.6 % — ABNORMAL HIGH (ref 11.5–14.5)
WBC: 7.7 10*3/uL (ref 3.6–11.0)

## 2015-02-04 LAB — GLUCOSE, CAPILLARY
Glucose-Capillary: 188 mg/dL — ABNORMAL HIGH (ref 65–99)
Glucose-Capillary: 236 mg/dL — ABNORMAL HIGH (ref 65–99)
Glucose-Capillary: 236 mg/dL — ABNORMAL HIGH (ref 65–99)
Glucose-Capillary: 161 mg/dL — ABNORMAL HIGH (ref 65–99)

## 2015-02-04 LAB — BASIC METABOLIC PANEL
ANION GAP: 4 — AB (ref 5–15)
BUN: 23 mg/dL — ABNORMAL HIGH (ref 6–20)
CALCIUM: 8.2 mg/dL — AB (ref 8.9–10.3)
CO2: 30 mmol/L (ref 22–32)
Chloride: 106 mmol/L (ref 101–111)
Creatinine, Ser: 0.9 mg/dL (ref 0.44–1.00)
GFR calc Af Amer: >60 mL/min (ref >60)
GFR calc non Af Amer: >60 mL/min (ref >60)
GLUCOSE: 215 mg/dL — AB (ref 65–99)
Potassium: 3.9 mmol/L (ref 3.5–5.1)
SODIUM: 140 mmol/L (ref 135–145)

## 2015-02-04 LAB — TROPONIN I
TROPONIN I: 0.03 ng/mL (ref <0.031)
TROPONIN I: 0.03 ng/mL (ref <0.031)

## 2015-02-04 MED ORDER — TECHNETIUM TC 99M DIETHYLENETRIAME-PENTAACETIC ACID
30.0000 | Freq: Once | INTRAVENOUS | Status: AC | PRN
  Administered 2015-02-04: 32.85 via INTRAVENOUS

## 2015-02-04 MED ORDER — HYDROCOD POLST-CPM POLST ER 10-8 MG/5ML PO SUER
5.0000 mL | Freq: Two times a day (BID) | ORAL | Status: DC | PRN
  Administered 2015-02-04 – 2015-02-05 (×2): 5 mL via ORAL
  Filled 2015-02-04 (×2): qty 5

## 2015-02-04 MED ORDER — TECHNETIUM TO 99M ALBUMIN AGGREGATED
4.0000 | Freq: Once | INTRAVENOUS | Status: AC | PRN
  Administered 2015-02-04: 3.35 via INTRAVENOUS

## 2015-02-04 NOTE — Progress Notes (Signed)
ANTIBIOTIC CONSULT NOTE - INITIAL  Pharmacy Consult for Levaquin  Indication: COPD exacerbation  Allergies  Allergen Reactions  . Metformin Hcl Diarrhea  . Penicillins Hives   Patient Measurements: Height: 5' (152.4 cm) Weight: 99 lb (44.906 kg) IBW/kg (Calculated) : 45.5  Vital Signs: Temp: 98.6 F (37 C) (12/30 0514) Temp Source: Oral (12/30 0514) BP: 145/57 mmHg (12/30 0514) Pulse Rate: 81 (12/30 0514)  Labs:  Recent Labs  02/03/15 1308 02/03/15 1422  WBC  --  13.0*  HGB  --  11.2*  PLT  --  288  CREATININE 1.00 0.95   Estimated Creatinine Clearance: 35.2 mL/min (by C-G formula based on Cr of 0.95).   Assessment: Pharmacy consulted to dose levofloxacin for COPD exacerbation/bronchitis in this 79 year old female. Patient has a CrCl of ~35 mL/min. This is day #2 of antibiotic therapy.   Goal of Therapy:  resolution of infection   Plan:  Patient received levofloxacin 500 mg dose x1 yesterday Continue with levofloxacin 250 mg IV daily  Change to PO antibiotics when possible  Pharmacy will continue to monitor, thank you for the consult.   Darylene Price Cleaster Shiffer 02/04/2015,7:19 AM

## 2015-02-04 NOTE — Evaluation (Signed)
Physical Therapy Evaluation Patient Details Name: Allison Francis MRN: [id number] DOB: [birth year] Today's Date: 02/04/2015   History of Present Illness  Pt is a 79 y/o female that presents with COPD exacerbation, worsening shortness of breath. 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   Clinical Impression  Patient is a pleasant 79 y/o female that has been quite active at baseline, volunteering in same day surgery. She typically wears 2L of O2 at home, however with COPD exacerbation she was desaturating to 87% on 4L during ambulation. No loss of balance, though drifting noted (which she states is her baseline). At this time would recommend some form of re-conditioning program for her (either OP PT or pulmonary rehab). Otherwise physically she appears at her baseline.     Follow Up Recommendations  (Pulmonary rehab or OP PT for conditioning)    Equipment Recommendations       Recommendations for Other Services       Precautions / Restrictions Precautions Precautions: None Restrictions Weight Bearing Restrictions: No      Mobility  Bed Mobility Overal bed mobility: Independent             General bed mobility comments: No deficits in bed mobility.   Transfers Overall transfer level: Independent               General transfer comment: No deficits or loss of balance noted.   Ambulation/Gait Ambulation/Gait assistance: Supervision Ambulation Distance (Feet): 200 Feet   Gait Pattern/deviations: Drifts right/left   Gait velocity interpretation: Below normal speed for age/gender General Gait Details: Patient demonstrates mild drifting but no loss of balance, able to turn 360 degrees quickly and safely with no assistance.   Stairs            Wheelchair Mobility    Modified Rankin (Stroke Patients Only)       Balance Overall balance assessment: Modified Independent (Modified DGI of 11/12)                                            Pertinent Vitals/Pain Pain Assessment: No/denies pain    Home Living Family/patient expects to be discharged to:: Private residence Living Arrangements: Alone   Type of Home: House Home Access: Level entry     Home Layout: One level        Prior Function Level of Independence: Independent         Comments: Patient is very active at baseline, reports mild drifting during ambulation but no use of AD.      Hand Dominance        Extremity/Trunk Assessment   Upper Extremity Assessment: Overall WFL for tasks assessed           Lower Extremity Assessment: Overall WFL for tasks assessed         Communication   Communication: No difficulties  Cognition Arousal/Alertness: Awake/alert Behavior During Therapy: WFL for tasks assessed/performed Overall Cognitive Status: Within Functional Limits for tasks assessed                      General Comments      Exercises        Assessment/Plan    PT Assessment Patient needs continued PT services  PT Diagnosis Generalized weakness   PT Problem List Decreased strength;Cardiopulmonary status limiting activity  PT Treatment Interventions Gait training;Therapeutic exercise;Therapeutic  activities   PT Goals (Current goals can be found in the Care Plan section) Acute Rehab PT Goals Patient Stated Goal: To return home safely  PT Goal Formulation: With patient Time For Goal Achievement: 02/18/15 Potential to Achieve Goals: Good    Frequency Min 2X/week   Barriers to discharge        Co-evaluation               End of Session Equipment Utilized During Treatment: Gait belt Activity Tolerance: Patient tolerated treatment well Patient left: in chair;with call bell/phone within reach Nurse Communication: Mobility status         Time: [id number] PT Time Calculation (min) (ACUTE ONLY): 15 min   Charges:   PT Evaluation $Initial PT Evaluation Tier I: 1 Procedure     PT G Codes:        Kerman Passey, PT, DPT    02/04/2015, 12:55 PM

## 2015-02-04 NOTE — Progress Notes (Signed)
[place] [facility] [place] NAME: Belissa Tannous    MR#:  [id number]  DATE OF BIRTH:  Jan 08, 1936  SUBJECTIVE:   Patient is feeling better this am She has sore throat from NEB treatments. Her SOB has improved  REVIEW OF SYSTEMS:    Review of Systems  Constitutional: Negative for fever, chills and malaise/fatigue.  HENT: Positive for sore throat.   Eyes: Negative for blurred vision.  Respiratory: Positive for shortness of breath. Negative for cough, hemoptysis and wheezing.   Cardiovascular: Negative for chest pain, palpitations and leg swelling.  Gastrointestinal: Negative for nausea, vomiting, abdominal pain, diarrhea and blood in stool.  Genitourinary: Negative for dysuria.  Musculoskeletal: Negative for back pain.  Neurological: Negative for dizziness, tremors and headaches.  Endo/Heme/Allergies: Does not bruise/bleed easily.    Tolerating Diet: YES      DRUG ALLERGIES:   Allergies  Allergen Reactions  . Metformin Hcl Diarrhea  . Penicillins Hives    VITALS:  Blood pressure 145/57, pulse 81, temperature 98.6 F (37 C), temperature source Oral, resp. rate 20, height 5' (1.524 m), weight 44.906 kg (99 lb), last menstrual period 02/05/1989, SpO2 97 %.  PHYSICAL EXAMINATION:   Physical Exam  Constitutional: She is oriented to person, place, and time and well-developed, well-nourished, and in no distress. No distress.  HENT:  Head: Normocephalic.  Eyes: No scleral icterus.  Neck: Normal range of motion. Neck supple. No JVD present. No tracheal deviation present.  Cardiovascular: Normal rate, regular rhythm and normal heart sounds.  Exam reveals no gallop and no friction rub.   No murmur heard. Pulmonary/Chest: Effort normal and breath sounds normal. No respiratory distress. She has no wheezes. She has no rales. She exhibits no tenderness.  Abdominal: Soft. Bowel sounds are normal. She exhibits no distension and no mass.  There is no tenderness. There is no rebound and no guarding.  Musculoskeletal: Normal range of motion. She exhibits no edema.  Neurological: She is alert and oriented to person, place, and time.  Skin: Skin is warm. No rash noted. No erythema.  Psychiatric: Affect and judgment normal.      LABORATORY PANEL:   CBC  Recent Labs Lab 02/04/15 0717  WBC 7.7  HGB 10.8*  HCT 34.7*  PLT 267   ------------------------------------------------------------------------------------------------------------------  Chemistries   Recent Labs Lab 02/03/15 1422 02/04/15 0717  NA 138 140  K 3.5 3.9  CL 102 106  CO2 30 30  GLUCOSE 155* 215*  BUN 24* 23*  CREATININE 0.95 0.90  CALCIUM 8.3* 8.2*  AST 54*  --   ALT 65*  --   ALKPHOS 81  --   BILITOT 0.8  --    ------------------------------------------------------------------------------------------------------------------  Cardiac Enzymes  Recent Labs Lab 02/03/15 1717 02/03/15 2351 02/04/15 0717  TROPONINI 0.04* 0.03 0.03   ------------------------------------------------------------------------------------------------------------------  RADIOLOGY:  Ct Chest W Contrast  02/03/2015  CLINICAL DATA:  79 year old female with history of cough and COPD. Cough for the past 6-7 months. Prior smoker for 30 years, quit 20 years ago. EXAM: CT CHEST WITH CONTRAST TECHNIQUE: Multidetector CT imaging of the chest was performed during intravenous contrast administration. CONTRAST:  14mL OMNIPAQUE IOHEXOL 300 MG/ML  SOLN COMPARISON:  PET-CT 10/29/2014. FINDINGS: Mediastinum/Lymph Nodes: Heart size is enlarged with right ventricular and right atrial dilatation. There is no significant pericardial fluid, thickening or pericardial calcification. There is atherosclerosis of the thoracic aorta, the great vessels of the mediastinum and the coronary arteries, including calcified atherosclerotic  plaque in the left main, left anterior descending, left  circumflex and right coronary arteries. Pulmonic trunk is mildly dilated measuring 3.1 cm in diameter. Multiple borderline enlarged and mildly enlarged mediastinal lymph nodes, similar to prior examinations, measuring up to 13 mm in short axis in the low right paratracheal nodal station. Small hiatal hernia. No axillary lymphadenopathy. Lungs/Pleura: Mild diffuse bronchial wall thickening with moderate to severe centrilobular and mild paraseptal emphysema, with extensive bullous changes throughout the right middle lobe and lingula. No acute consolidative airspace disease. Trace right pleural effusion layering dependently. No suspicious appearing pulmonary nodules or masses. Linear areas of scarring are noted in the mid lungs bilaterally. Upper Abdomen: Atherosclerosis. Musculoskeletal/Soft Tissues: There are no aggressive appearing lytic or blastic lesions noted in the visualized portions of the skeleton. IMPRESSION: 1. No acute findings. 2. Diffuse bronchial thickening with moderate to severe centrilobular and mild paraseptal emphysema, including advanced bullous changes in the right middle lobe and lingula; imaging findings compatible with the reported clinical history of COPD. 3. Atherosclerosis, including left main and 3 vessel coronary artery disease. Assessment for potential risk factor modification, dietary therapy or pharmacologic therapy may be warranted, if clinically indicated. 4. Cardiomegaly with right ventricular and right atrial dilatation. 5. Mild dilatation of the pulmonic trunk (3.1 cm in diameter), suggestive of pulmonary arterial hypertension. Electronically Signed   By: Vinnie Langton M.D.   On: 02/03/2015 15:08     ASSESSMENT AND PLAN:   79 y/o female with COPD on 2 liters of O2 at home, type 2 diabetes and does have heart failure with pulmonary hypertension who presents with worsening insurance of breath and hypoxia.  1. Acute on chronic COPD exacerbation with acute bronchitis:  Patient underwent CT scan which did not show evidence of pneumonia. Continue Levaquin for bronchitis and oxygen support. Wean oxygen to baseline of 2 L.  2. Pulmonary hypertension: 2-D echocardiogram this morning shows severe pulmonary hypertension. Dr. Raul Del has been consulted for evaluation and recommendations. Patient may benefit from cardiology evaluation as well. Dr. Clayborn Bigness is her cardiologist.  3. Essential hypertension: Patient's blood pressure has improved. Continue with Toprol, Norvasc and losartan.  4. History of atrial fibrillation, paroxysmal: Continue digoxin and the Toprol. Patient is not on anticoagulation.  5. Depression: Continue Remeron      Management plans discussed with the patient and she is in agreement.  CODE STATUS: FULL  TOTAL TIME TAKING CARE OF THIS PATIENT: 30 minutes.     POSSIBLE D/C 1-2 days, DEPENDING ON CLINICAL CONDITION.   Zurie Platas M.D on 02/04/2015 at 10:47 AM  Between 7am to 6pm - Pager - [phone] After 6pm go to [email or web address] - password [email or web address] [facility]  Office  [phone]  CC: Primary care physician; Loistine Chance, MD  Note: This dictation was prepared with Dragon dictation along with smaller phrase technology. Any transcriptional errors that result from this process are unintentional.

## 2015-02-04 NOTE — Consult Note (Addendum)
[facility] Consultation      Assessment and Plan:  Acute exacerbation of COPD with acute respiratory failure. -Appears to be doing better with IV steroids. Continue short course of antibiotics, continue Dulera and DuoNeb's.  Severe emphysema with chronic respiratory failure. -Severe baseline emphysema with chronic respiratory failure, continue current lung medications.  Severe pulmonary hypertension. -Severe pulmonary hypertension with peak pressure of 113 mmHg. This may be secondary to severe COPD, and diastolic dysfunction, or may also be multifactorial due to primary pulmonary hypertension. -We'll check a VQ scan to look for evidence of chronic thromboembolic disease, which could be missed by a CT of the chest with contrast. If this test is positive for chronic thromboembolic disease, the patient will  need to start anticoagulation. -Patient will require further follow-up and testing once her acute exacerbation symptoms have resolved. This can be completed with her primary pulmonologist who is Dr. Raul Del.   Dr. Mortimer Fries will be available to attend to any acute issues over the weekend. Otherwise, Dr. Raul Del will resume care of the patient on Tuesday.  Date: 02/04/2015  MRN# [id number] Allison Francis 1935-10-12  Referring Physician: Dr. Benjie Karvonen.   Allison Francis is a 79 y.o. old female seen in consultation for chief complaint of:    Chief Complaint  Patient presents with  . Shortness of Breath    HPI:  Allison Francis is a 79 y.o. female with a known history of type 2 diabetes mellitus, COPD on 2 L home oxygen, diastolic CHF, pulmonary hypertension, prediabetes, hypertension and arthritis presents to the hospital secondary to worsening shortness of breath and hypoxia. She normally sees Dr. Raul Del as her pulmonologist. She presented to the hospital with increasing shortness of breath over the last 4-5 days, which was much worse than baseline. She felt that the breathing  was similar to previous episodes of COPD exacerbation that she has had in the past.  She had an echocardiogram which showed severe pulmonary hypertension and normal ejection fraction as detailed below. She underwent a CT of the chest which showed severe emphysema. There was also consistent with pulmonary hypertension. She has subsequently been started on Solu-Medrol which is currently receiving at 60 mg every 8 hours, she is also receiving levofloxacin, Dulera, duo nebs.  Review and summary of previous testing and medical charts: -Review of previous CT chest and chest x-ray images and reports: Shows severe  emphysema with severe air trapping. There is also severely dilated right atrium and moderately dilated right ventricle, consistent with severe pulmonary hypertension. -Echocardiogram 02/04/2015: EF 55%, mildly dilated right ventricle, moderate to severe tricuspid regurgitation, pulmonary artery, peak pressure elevated at 113 mmHg, appears to be consistent with severe pulmonary hypertension.  PMHX:   Past Medical History  Diagnosis Date  . Type II or unspecified type diabetes mellitus without mention of complication, not stated as uncontrolled   . Chronic airway obstruction, not elsewhere classified     on 2L o2 at home  . Congestive heart failure, unspecified   . Pure hypercholesterolemia   . Essential hypertension, benign   . Gouty arthropathy   . Coronary atherosclerosis of unspecified type of vessel, native or graft 2011  . Arthritis   . Hernia 2011  . Hypertension 1966  . Personal history of tobacco use, presenting hazards to health   . Breast screening, unspecified   . Mammographic microcalcification 2013  . Special screening for malignant neoplasms, colon   . Bowel trouble 1968  . Bronchiolitis 2014  .  Pulmonary hypertension ([place])    Surgical Hx:  Past Surgical History  Procedure Laterality Date  . Cardiac catheterization  01-02-00    cardiac stent, RCA  . Hernia repair   2011  . Coronary angioplasty with stent placement  2011  . Colonoscopy  2005  . Breast biopsy Right     neg   Family Hx:  Family History  Problem Relation Age of Onset  . Multiple myeloma Mother   . Heart attack Mother   . Breast cancer Mother 63  . Diabetes Mother   . Hypertension      fx   Social Hx:   Social History  Substance Use Topics  . Smoking status: Former Smoker -- 1.00 packs/day for 35 years    Types: Cigarettes    Quit date: 02/06/1996  . Smokeless tobacco: Never Used     Comment: quit 25 years ago  . Alcohol Use: 3.6 oz/week    6 Standard drinks or equivalent per week     Comment: occasional wine   Medication:   No current outpatient prescriptions on file.    Allergies:  Metformin hcl and Penicillins  Review of Systems: Gen:  Denies  fever, sweats, chills HEENT: Denies blurred vision, double vision.  Cvc:  No dizziness, chest pain. Resp:   Denies cough or sputum porduction. Gi: Denies swallowing difficulty, stomach pain. Gu:  Denies bladder incontinence, burning urine Ext:   No Joint pain, stiffness. Skin: No skin rash,  hives Endoc:  No polyuria, polydipsia. Psych: No depression, insomnia. Other:  All other systems were reviewed with the patient and were negative other that what is mentioned in the HPI.   Physical Examination:   VS: BP 145/57 mmHg  Pulse 81  Temp(Src) 98.6 F (37 C) (Oral)  Resp 20  Ht 5' (1.524 m)  Wt 99 lb (44.906 kg)  BMI 19.33 kg/m2  SpO2 97%  LMP 02/05/1989  General Appearance: No distress  Neuro:without focal findings,  speech normal,  HEENT: PERRLA, EOM intact.   Pulmonary: Scattered bilateral crackles, decreased air entry both bases. CardiovascularNormal S1,S2.  No m/r/g.   Abdomen: Benign, Soft, non-tender. Renal:  No costovertebral tenderness  GU:  No performed at this time. Endoc: No evident thyromegaly, no signs of acromegaly. Skin:   warm, no rashes, no ecchymosis  Extremities: normal, no cyanosis,  clubbing.  Other findings:    LABORATORY PANEL:   CBC  Recent Labs Lab 02/04/15 0717  WBC 7.7  HGB 10.8*  HCT 34.7*  PLT 267   ------------------------------------------------------------------------------------------------------------------  Chemistries   Recent Labs Lab 02/03/15 1422 02/04/15 0717  NA 138 140  K 3.5 3.9  CL 102 106  CO2 30 30  GLUCOSE 155* 215*  BUN 24* 23*  CREATININE 0.95 0.90  CALCIUM 8.3* 8.2*  AST 54*  --   ALT 65*  --   ALKPHOS 81  --   BILITOT 0.8  --    ------------------------------------------------------------------------------------------------------------------  Cardiac Enzymes  Recent Labs Lab 02/04/15 0717  TROPONINI 0.03   ------------------------------------------------------------  RADIOLOGY:  Ct Chest W Contrast  02/03/2015  CLINICAL DATA:  79 year old female with history of cough and COPD. Cough for the past 6-7 months. Prior smoker for 30 years, quit 20 years ago. EXAM: CT CHEST WITH CONTRAST TECHNIQUE: Multidetector CT imaging of the chest was performed during intravenous contrast administration. CONTRAST:  31m OMNIPAQUE IOHEXOL 300 MG/ML  SOLN COMPARISON:  PET-CT 10/29/2014. FINDINGS: Mediastinum/Lymph Nodes: Heart size is enlarged with right ventricular and right  atrial dilatation. There is no significant pericardial fluid, thickening or pericardial calcification. There is atherosclerosis of the thoracic aorta, the great vessels of the mediastinum and the coronary arteries, including calcified atherosclerotic plaque in the left main, left anterior descending, left circumflex and right coronary arteries. Pulmonic trunk is mildly dilated measuring 3.1 cm in diameter. Multiple borderline enlarged and mildly enlarged mediastinal lymph nodes, similar to prior examinations, measuring up to 13 mm in short axis in the low right paratracheal nodal station. Small hiatal hernia. No axillary lymphadenopathy. Lungs/Pleura: Mild  diffuse bronchial wall thickening with moderate to severe centrilobular and mild paraseptal emphysema, with extensive bullous changes throughout the right middle lobe and lingula. No acute consolidative airspace disease. Trace right pleural effusion layering dependently. No suspicious appearing pulmonary nodules or masses. Linear areas of scarring are noted in the mid lungs bilaterally. Upper Abdomen: Atherosclerosis. Musculoskeletal/Soft Tissues: There are no aggressive appearing lytic or blastic lesions noted in the visualized portions of the skeleton. IMPRESSION: 1. No acute findings. 2. Diffuse bronchial thickening with moderate to severe centrilobular and mild paraseptal emphysema, including advanced bullous changes in the right middle lobe and lingula; imaging findings compatible with the reported clinical history of COPD. 3. Atherosclerosis, including left main and 3 vessel coronary artery disease. Assessment for potential risk factor modification, dietary therapy or pharmacologic therapy may be warranted, if clinically indicated. 4. Cardiomegaly with right ventricular and right atrial dilatation. 5. Mild dilatation of the pulmonic trunk (3.1 cm in diameter), suggestive of pulmonary arterial hypertension. Electronically Signed   By: Vinnie Langton M.D.   On: 02/03/2015 15:08       Thank  you for the consultation and for allowing [facility] to assist in the care of your patient. Our recommendations are noted above.  Please contact us if we can be of further service.   Marda Stalker, MD.  Board Certified in Internal Medicine, Pulmonary Medicine, [place], and Sleep Medicine.  [facility]   Patricia Pesa, M.D.  Vilinda Boehringer, M.D.  Merton Border, M.D

## 2015-02-04 NOTE — Progress Notes (Signed)
*  PRELIMINARY RESULTS* Echocardiogram 2D Echocardiogram has been performed.  Laqueta Jean Hege 02/04/2015, 8:31 AM

## 2015-02-04 NOTE — Plan of Care (Signed)
Problem: Education: Goal: Knowledge of [facility] Education information/materials will improve Outcome: Completed/Met Date Met:  02/04/15 Plan of care progress: -IV solumedrol continues -IV abts -o2 remains at 4L [place], chronic o2 at 2L at home -dyspnea on exertion, improves at rest -neb txs continue -pulmonology consulting -up to [facility] independ -PRN cough med for non productive cough -no complaints of pain, no discomfort noted

## 2015-02-04 NOTE — Plan of Care (Signed)
Problem: Discharge Progression Outcomes Goal: Other Discharge Outcomes/Goals Outcome: Progressing Pt has been maintaining >90% O2 during shift. Pt reported that the TED hose made her feet start cramping. Pt later report her legs cramp sometimes. Pt has been resting comfortably during shift. No other signs of distress noted. Will continue to monitor.

## 2015-02-05 ENCOUNTER — Inpatient Hospital Stay
Admission: EM | Admit: 2015-02-05 | Discharge: 2015-03-09 | Disposition: E | Payer: Medicare Other | Source: Home / Self Care | Attending: Internal Medicine | Admitting: Internal Medicine

## 2015-02-05 ENCOUNTER — Encounter: Payer: Self-pay | Admitting: Emergency Medicine

## 2015-02-05 ENCOUNTER — Inpatient Hospital Stay: Payer: Medicare Other

## 2015-02-05 DIAGNOSIS — R55 Syncope and collapse: Secondary | ICD-10-CM

## 2015-02-05 DIAGNOSIS — R001 Bradycardia, unspecified: Secondary | ICD-10-CM | POA: Diagnosis present

## 2015-02-05 DIAGNOSIS — I951 Orthostatic hypotension: Secondary | ICD-10-CM

## 2015-02-05 DIAGNOSIS — Z87898 Personal history of other specified conditions: Secondary | ICD-10-CM | POA: Insufficient documentation

## 2015-02-05 LAB — URINALYSIS COMPLETE WITH MICROSCOPIC (ARMC ONLY)
Bilirubin Urine: NEGATIVE
GLUCOSE, UA: 50 mg/dL — AB
Hgb urine dipstick: NEGATIVE
KETONES UR: NEGATIVE mg/dL
Leukocytes, UA: NEGATIVE
NITRITE: NEGATIVE
Specific Gravity, Urine: 1.019 (ref 1.005–1.030)
pH: 6 (ref 5.0–8.0)

## 2015-02-05 LAB — CBC WITH DIFFERENTIAL/PLATELET
BASOS ABS: 0 10*3/uL (ref 0–0.1)
Basophils Relative: 0 %
Eosinophils Absolute: 0 10*3/uL (ref 0–0.7)
Eosinophils Relative: 0 %
HEMATOCRIT: 38.5 % (ref 35.0–47.0)
Hemoglobin: 11.5 g/dL — ABNORMAL LOW (ref 12.0–16.0)
Lymphs Abs: 0.7 10*3/uL — ABNORMAL LOW (ref 1.0–3.6)
MCH: 21.5 pg — ABNORMAL LOW (ref 26.0–34.0)
MCHC: 29.7 g/dL — ABNORMAL LOW (ref 32.0–36.0)
MCV: 72.4 fL — AB (ref 80.0–100.0)
MONO ABS: 0.7 10*3/uL (ref 0.2–0.9)
Monocytes Relative: 5 %
NEUTROS ABS: 12.8 10*3/uL — AB (ref 1.4–6.5)
Neutrophils Relative %: 90 %
Platelets: 348 10*3/uL (ref 150–440)
RBC: 5.32 MIL/uL — AB (ref 3.80–5.20)
RDW: 17.8 % — AB (ref 11.5–14.5)
WBC: 14.1 10*3/uL — ABNORMAL HIGH (ref 3.6–11.0)

## 2015-02-05 LAB — BASIC METABOLIC PANEL
Anion gap: 9 (ref 5–15)
BUN: 30 mg/dL — AB (ref 6–20)
CHLORIDE: 103 mmol/L (ref 101–111)
CO2: 27 mmol/L (ref 22–32)
CREATININE: 0.99 mg/dL (ref 0.44–1.00)
Calcium: 8.6 mg/dL — ABNORMAL LOW (ref 8.9–10.3)
GFR calc Af Amer: >60 mL/min (ref >60)
GFR calc non Af Amer: 54 mL/min — ABNORMAL LOW (ref >60)
GLUCOSE: 248 mg/dL — AB (ref 65–99)
Potassium: 4.8 mmol/L (ref 3.5–5.1)
Sodium: 139 mmol/L (ref 135–145)

## 2015-02-05 LAB — BLOOD GAS, ARTERIAL
ACID-BASE DEFICIT: 3.8 mmol/L — AB (ref 0.0–2.0)
BICARBONATE: 23.1 meq/L (ref 21.0–28.0)
FIO2: 0.28
O2 SAT: 91.6 %
PCO2 ART: 48 mmHg (ref 32.0–48.0)
PO2 ART: 70 mmHg — AB (ref 83.0–108.0)
Patient temperature: 37
pH, Arterial: 7.29 — ABNORMAL LOW (ref 7.350–7.450)

## 2015-02-05 LAB — GLUCOSE, CAPILLARY
GLUCOSE-CAPILLARY: 156 mg/dL — AB (ref 65–99)
GLUCOSE-CAPILLARY: 161 mg/dL — AB (ref 65–99)
GLUCOSE-CAPILLARY: 148 mg/dL — AB (ref 65–99)

## 2015-02-05 LAB — MRSA PCR SCREENING: MRSA BY PCR: NEGATIVE

## 2015-02-05 LAB — DIGOXIN LEVEL: Digoxin Level: 2.1 ng/mL — ABNORMAL HIGH (ref 0.8–2.0)

## 2015-02-05 LAB — TROPONIN I: Troponin I: 0.03 ng/mL (ref <0.031)

## 2015-02-05 MED ORDER — ONDANSETRON HCL 4 MG/2ML IJ SOLN
4.0000 mg | Freq: Once | INTRAMUSCULAR | Status: AC
  Administered 2015-02-05: 4 mg via INTRAVENOUS
  Filled 2015-02-05: qty 2

## 2015-02-05 MED ORDER — ONDANSETRON HCL 4 MG/2ML IJ SOLN: 4.0000 mg | Freq: Four times a day (QID) | INTRAMUSCULAR | Status: DC | PRN

## 2015-02-05 MED ORDER — SODIUM CHLORIDE 0.9 % IV BOLUS (SEPSIS)
1000.0000 mL | Freq: Once | INTRAVENOUS | Status: AC
  Administered 2015-02-05: 1000 mL via INTRAVENOUS

## 2015-02-05 MED ORDER — LEVOFLOXACIN 500 MG PO TABS
250.0000 mg | ORAL_TABLET | Freq: Every day | ORAL | Status: DC
  Administered 2015-02-05 – 2015-02-06 (×2): 250 mg via ORAL
  Filled 2015-02-05 (×2): qty 1

## 2015-02-05 MED ORDER — ACETAMINOPHEN 650 MG RE SUPP: 650.0000 mg | Freq: Four times a day (QID) | RECTAL | Status: DC | PRN

## 2015-02-05 MED ORDER — ATROPINE SULFATE 1 MG/ML IJ SOLN
0.5000 mg | Freq: Once | INTRAMUSCULAR | Status: AC
  Administered 2015-02-05: 0.5 mg via INTRAVENOUS
  Filled 2015-02-05: qty 0.5

## 2015-02-05 MED ORDER — ATORVASTATIN CALCIUM 20 MG PO TABS
80.0000 mg | ORAL_TABLET | Freq: Every day | ORAL | Status: DC
  Administered 2015-02-06 (×2): 80 mg via ORAL
  Filled 2015-02-05 (×2): qty 4

## 2015-02-05 MED ORDER — IPRATROPIUM-ALBUTEROL 0.5-2.5 (3) MG/3ML IN SOLN
3.0000 mL | RESPIRATORY_TRACT | Status: DC
  Administered 2015-02-06 (×5): 3 mL via RESPIRATORY_TRACT
  Filled 2015-02-05 (×5): qty 3

## 2015-02-05 MED ORDER — ENOXAPARIN SODIUM 40 MG/0.4ML ~~LOC~~ SOLN
40.0000 mg | SUBCUTANEOUS | Status: DC
  Administered 2015-02-05: 40 mg via SUBCUTANEOUS
  Filled 2015-02-05: qty 0.4

## 2015-02-05 MED ORDER — ONDANSETRON HCL 4 MG PO TABS: 4.0000 mg | ORAL_TABLET | Freq: Four times a day (QID) | ORAL | Status: DC | PRN

## 2015-02-05 MED ORDER — ATROPINE SULFATE 0.4 MG/ML IJ SOLN: 0.4000 mg | Freq: Once | INTRAMUSCULAR | Status: DC | PRN

## 2015-02-05 MED ORDER — ALLOPURINOL 100 MG PO TABS: 100.0000 mg | ORAL_TABLET | ORAL | Status: DC | PRN

## 2015-02-05 MED ORDER — INSULIN ASPART 100 UNIT/ML ~~LOC~~ SOLN: 0.0000 [IU] | Freq: Every day | SUBCUTANEOUS | Status: DC

## 2015-02-05 MED ORDER — INSULIN ASPART 100 UNIT/ML ~~LOC~~ SOLN
0.0000 [IU] | Freq: Three times a day (TID) | SUBCUTANEOUS | Status: DC
  Administered 2015-02-06: 2 [IU] via SUBCUTANEOUS
  Filled 2015-02-05: qty 2

## 2015-02-05 MED ORDER — HALOPERIDOL LACTATE 5 MG/ML IJ SOLN
5.0000 mg | Freq: Once | INTRAMUSCULAR | Status: AC
  Administered 2015-02-05: 5 mg via INTRAVENOUS
  Filled 2015-02-05: qty 1

## 2015-02-05 MED ORDER — IOHEXOL 350 MG/ML SOLN
75.0000 mL | Freq: Once | INTRAVENOUS | Status: AC | PRN
  Administered 2015-02-05: 75 mL via INTRAVENOUS

## 2015-02-05 MED ORDER — ASPIRIN 81 MG PO CHEW
324.0000 mg | CHEWABLE_TABLET | Freq: Once | ORAL | Status: AC
  Administered 2015-02-06: 324 mg via ORAL
  Filled 2015-02-05: qty 4

## 2015-02-05 MED ORDER — METHYLPREDNISOLONE SODIUM SUCC 40 MG IJ SOLR
40.0000 mg | Freq: Every day | INTRAMUSCULAR | Status: DC
  Administered 2015-02-06: 40 mg via INTRAVENOUS
  Filled 2015-02-05: qty 1

## 2015-02-05 MED ORDER — MOMETASONE FURO-FORMOTEROL FUM 100-5 MCG/ACT IN AERO
2.0000 | INHALATION_SPRAY | Freq: Two times a day (BID) | RESPIRATORY_TRACT | Status: DC
  Administered 2015-02-05 – 2015-02-06 (×2): 2 via RESPIRATORY_TRACT
  Filled 2015-02-05: qty 8.8

## 2015-02-05 MED ORDER — LEVOFLOXACIN 250 MG PO TABS
250.0000 mg | ORAL_TABLET | Freq: Every day | ORAL | Status: DC
Start: 2015-02-05 — End: 2015-02-05

## 2015-02-05 MED ORDER — PREDNISOLONE ACETATE 1 % OP SUSP
1.0000 [drp] | Freq: Four times a day (QID) | OPHTHALMIC | Status: DC
  Administered 2015-02-06 (×2): 1 [drp] via OPHTHALMIC
  Filled 2015-02-05: qty 1

## 2015-02-05 MED ORDER — MIRTAZAPINE 15 MG PO TABS
15.0000 mg | ORAL_TABLET | Freq: Every day | ORAL | Status: DC
  Administered 2015-02-05: 15 mg via ORAL
  Filled 2015-02-05: qty 1

## 2015-02-05 MED ORDER — ASPIRIN EC 81 MG PO TBEC
81.0000 mg | DELAYED_RELEASE_TABLET | Freq: Every day | ORAL | Status: DC
  Administered 2015-02-06: 81 mg via ORAL
  Filled 2015-02-05: qty 1

## 2015-02-05 MED ORDER — ACETAMINOPHEN 325 MG PO TABS: 650.0000 mg | ORAL_TABLET | Freq: Four times a day (QID) | ORAL | Status: DC | PRN

## 2015-02-05 MED ORDER — PREDNISONE 10 MG PO TABS: 10.0000 mg | ORAL_TABLET | Freq: Every day | ORAL | Status: AC

## 2015-02-05 MED ORDER — PANTOPRAZOLE SODIUM 40 MG PO TBEC
40.0000 mg | DELAYED_RELEASE_TABLET | Freq: Every day | ORAL | Status: DC
Start: 2015-02-06 — End: 2015-02-07
  Administered 2015-02-06: 40 mg via ORAL
  Filled 2015-02-05: qty 1

## 2015-02-05 MED ORDER — METHYLPREDNISOLONE SODIUM SUCC 40 MG IJ SOLR: 40.0000 mg | Freq: Every day | INTRAMUSCULAR | Status: DC

## 2015-02-05 MED ORDER — SODIUM CHLORIDE 0.9 % IJ SOLN
3.0000 mL | Freq: Two times a day (BID) | INTRAMUSCULAR | Status: DC
  Administered 2015-02-05 – 2015-02-06 (×2): 3 mL via INTRAVENOUS

## 2015-02-05 MED ORDER — BENZONATATE 100 MG PO CAPS: 100.0000 mg | ORAL_CAPSULE | Freq: Three times a day (TID) | ORAL | Status: DC | PRN

## 2015-02-05 MED ORDER — LIDOCAINE 5 % EX PTCH
1.0000 | MEDICATED_PATCH | Freq: Every day | CUTANEOUS | Status: DC | PRN
  Filled 2015-02-05: qty 1

## 2015-02-05 NOTE — ED Notes (Signed)
Pt transported to CT ?

## 2015-02-05 NOTE — ED Notes (Addendum)
Ambulated patient per MD request. While patient was ambulating she became dizzy. RN and tech assisted patient back to the bed, patient began c/o feeling as if "your faces are fading out". MD paged to patient bedside.

## 2015-02-05 NOTE — ED Notes (Signed)
Called CCU to ask status of bed assignment. CCU charge reported she would mark bed clear and return call.

## 2015-02-05 NOTE — Progress Notes (Signed)
ANTIBIOTIC CONSULT NOTE - Follow Up  Pharmacy Consult for Levaquin  Indication: COPD exacerbation  Allergies  Allergen Reactions  . Metformin Hcl Diarrhea  . Penicillins Hives   Patient Measurements: Height: 5' (152.4 cm) Weight: 99 lb (44.906 kg) IBW/kg (Calculated) : 45.5  Vital Signs: BP: 129/51 mmHg (12/31 0511) Pulse Rate: 66 (12/31 0511)  Labs:  Recent Labs  02/03/15 1308 02/03/15 1422 02/04/15 0717  WBC  --  13.0* 7.7  HGB  --  11.2* 10.8*  PLT  --  288 267  CREATININE 1.00 0.95 0.90   Estimated Creatinine Clearance: 37.1 mL/min (by C-G formula based on Cr of 0.9).   Assessment: Pharmacy consulted to dose levofloxacin for COPD exacerbation/bronchitis in this 79 year old female. Patient has a CrCl of ~35 mL/min. This is day #3 of antibiotic therapy.    Plan:  Patient received levofloxacin 500 mg dose x1 on 12/29 and then levofloxacin 250 mg IV daily. Patient is taking other oral medications and has diet ordered. Will change Abx from IV to PO per policy.   Pharmacy will continue to monitor and make adjustments as needed.   Nancy Fetter, PharmD Pharmacy Resident 01/17/2015,10:23 AM

## 2015-02-05 NOTE — Discharge Instructions (Signed)
Acute Respiratory Distress Syndrome °Acute respiratory distress syndrome is a life-threatening condition in which fluid collects in the lungs. This condition can cause severe shortness of breath and low oxygen levels in the blood. It can also cause the lungs and other vital organs to fail. The condition usually develops within 24 to 48 hours of an infection, illness, surgery, or injury. °CAUSES °This condition may be caused by: °· An infection, such as sepsis or pneumonia. °· A serious injury to the head or chest. °· A major surgery. °· A drug overdose. °· Breathing in harmful chemicals or smoke. °· Blood transfusions. °· A blood clot in the lungs. °SYMPTOMS °Symptoms of this condition include: °· Fever. °· Difficulty breathing. °· Bluish skin (cyanosis). °· A fast or irregular heartbeat. °· Low blood pressure (hypotension). °· Agitation. °· Confusion. °· Lack of energy (lethargy). °· Sweating. °DIAGNOSIS °This condition may be diagnosed by using tests to rule out other diseases and conditions that cause similar symptoms. You may have: °· A chest X-ray or CT scan. °· Blood tests. °· A sputum culture. In this test, you will be asked to spit so that a sample of lung fluid can be taken for testing. °· Bronchoscopy. During this test a thin, flexible tool is passed into the mouth or nose, down the windpipe, and into the lungs. °TREATMENT °This condition may be treated with: °· Oxygen. A breathing machine (ventilator) is often used to provide oxygen and help with breathing. °· Medicine to help you relax (sedative). °· Fluids and nutrients given through an IV tube. °· Blood pressure medicine. °· Steroid medicine to help decrease inflammation in the lungs. °· Diuretic medicine to get rid of extra fluid in the body. °Additional treatment may be needed, depending on the cause of the condition. It can take up to 12 months to recover from this condition. Some people recover fully, but others may continue to  have: °· Weakness. °· Shortness of breath. °· Memory problems. °· Depression. °HOME CARE INSTRUCTIONS °Until you recover from this condition: °· Do not smoke. °· Limit alcohol intake to no more than 1 drink per day for nonpregnant women and 2 drinks per day for men. One drink equals 12 oz of beer, 5 oz of wine, or 1½ oz of hard liquor. °· Ask friends and family to help you if daily activities make you tired. °SEEK MEDICAL CARE IF: °· You become short of breath with activity or while resting. °· You develop a cough that does not go away. °· You have a fever. °SEEK IMMEDIATE MEDICAL CARE IF: °· You have sudden shortness of breath. °· You develop chest pain that does not go away. °· You develop swelling or pain in one of your legs. °· You cough up blood. °  °This information is not intended to replace advice given to you by your health care provider. Make sure you discuss any questions you have with your health care provider. °  °Document Released: 01/22/2005 Document Revised: 06/08/2014 Document Reviewed: 01/18/2014 °Elsevier Interactive Patient Education ©2016 [employer] ° °

## 2015-02-05 NOTE — Discharge Summary (Signed)
[facility] NAME: Riyaan Schwabe    MR#:  [id number]  DATE OF BIRTH:  08-30-1935  DATE OF ADMISSION:  02/03/2015 ADMITTING PHYSICIAN: Gladstone Lighter, MD  DATE OF DISCHARGE: 01/12/2015  PRIMARY CARE PHYSICIAN: Loistine Chance, MD    ADMISSION DIAGNOSIS:  Abnormal EKG [R94.31] Hypoxia [R09.02] Elevated troponin [R79.89] COPD exacerbation (HCC) [J44.1]  DISCHARGE DIAGNOSIS:  Active Problems:   COPD (chronic obstructive pulmonary disease) ([place])   SECONDARY DIAGNOSIS:   Past Medical History  Diagnosis Date  . Type II or unspecified type diabetes mellitus without mention of complication, not stated as uncontrolled   . Chronic airway obstruction, not elsewhere classified     on 2L o2 at home  . Congestive heart failure, unspecified   . Pure hypercholesterolemia   . Essential hypertension, benign   . Gouty arthropathy   . Coronary atherosclerosis of unspecified type of vessel, native or graft 2011  . Arthritis   . Hernia 2011  . Hypertension 1966  . Personal history of tobacco use, presenting hazards to health   . Breast screening, unspecified   . Mammographic microcalcification 2013  . Special screening for malignant neoplasms, colon   . Bowel trouble 1968  . Bronchiolitis 2014  . Pulmonary hypertension [facility])     HOSPITAL COURSE:   79 y/o female with COPD on 2 liters of O2 at home, type 2 diabetes and does have heart failure with pulmonary hypertension who presents with worsening insurance of breath and hypoxia.  1. Acute on chronic COPD exacerbation with acute bronchitis: Patient underwent CT scan which did not show evidence of pneumonia or heart failure. She was started on Levaquin for bronchitis and oxygen support. Her oxygen goal is 90-92%.  2. Pulmonary hypertension: 2-D echocardiogram showed severe pulmonary hypertension. She was seen and evaluated by pulmonary while in the hospital. They recommended a  VQ scan which did not show chronic thrombo-embolic disease. Her severe pulmonary hypertension is from her bolus emphysema. Patient will need outpatient follow-up with her pulmonologist for possible medications for pulmonary hypertension.   3. Essential hypertension: Patient's blood pressure has improved. Continue with Toprol, Norvasc and losartan.  4. History of atrial fibrillation, paroxysmal: Continue digoxin and the Toprol. Patient is not on anticoagulation.  5. Depression: Continue Remeron    DISCHARGE CONDITIONS AND DIET:   Patient is stable for discharge on a heart healthy diet  CONSULTS OBTAINED:  Treatment Team:  Flora Lipps, MD  DRUG ALLERGIES:   Allergies  Allergen Reactions  . Metformin Hcl Diarrhea  . Penicillins Hives    DISCHARGE MEDICATIONS:   Current Discharge Medication List    START taking these medications   Details  predniSONE (DELTASONE) 10 MG tablet Take 1 tablet (10 mg total) by mouth daily with breakfast. 60 mg PO (oral)  x 2 days 50 mg PO x 2 days 40 mg PO x 2 days 30 mg PO x 2 days 20 mg PO x 2 days 10 mg PO x 2 days then stop Qty: 30 tablet, Refills: 0      CONTINUE these medications which have NOT CHANGED   Details  albuterol (PROAIR HFA) 108 (90 Base) MCG/ACT inhaler Inhale 2 puffs into the lungs every 6 (six) hours as needed.    allopurinol (ZYLOPRIM) 100 MG tablet Take 1 tablet (100 mg total) by mouth daily. Qty: 30 tablet, Refills: 5   Associated Diagnoses: Controlled gout    amLODipine (NORVASC) 5 MG tablet  Take 1 tablet (5 mg total) by mouth daily. Qty: 30 tablet, Refills: 5   Associated Diagnoses: Essential hypertension    aspirin EC 81 MG tablet Take 81 mg by mouth daily.    benzonatate (TESSALON) 100 MG capsule Take 100 mg by mouth 3 (three) times daily as needed for cough.    digoxin (LANOXIN) 0.125 MG tablet Take 1 tablet by mouth daily. Refills: 2    Fluticasone-Salmeterol (ADVAIR DISKUS) 250-50 MCG/DOSE AEPB Inhale 1  puff into the lungs every 12 (twelve) hours.    HYDROcodone-homatropine (HYCODAN) 5-1.5 MG/5ML syrup Take by mouth as needed.    lidocaine (LIDODERM) 5 % Apply 1 patch topically daily as needed. Remove & Discard patch within 12 hours or as directed by MD    losartan (COZAAR) 100 MG tablet Take 1 tablet (100 mg total) by mouth daily. Qty: 30 tablet, Refills: 5   Associated Diagnoses: Coronary artery disease involving native coronary artery of native heart without angina pectoris; Essential hypertension    metoprolol succinate (TOPROL-XL) 50 MG 24 hr tablet Take 1 tablet (50 mg total) by mouth daily. Take with or immediately following a meal. Qty: 30 tablet, Refills: 5   Associated Diagnoses: Coronary artery disease involving native coronary artery of native heart without angina pectoris; Essential hypertension    mirtazapine (REMERON) 15 MG tablet TAKE 1 TABLET BY MOUTH IN THE EVENING FOR APPETITE Qty: 90 tablet, Refills: 1    omeprazole (PRILOSEC) 20 MG capsule Take 1 capsule (20 mg total) by mouth daily. Qty: 30 capsule, Refills: 5   Associated Diagnoses: Gastroesophageal reflux disease without esophagitis    prednisoLONE acetate (PRED FORTE) 1 % ophthalmic suspension PLACE 1 DROP INTO BOTH EYES EVERY DAY AS NEEDED Refills: 3              Today   CHIEF COMPLAINT:  Patient doing well this morning. Patient has baseline shortness of breath no chest pain.   VITAL SIGNS:  Blood pressure 129/51, pulse 66, temperature 98.4 F (36.9 C), temperature source Oral, resp. rate 18, height 5' (1.524 m), weight 44.906 kg (99 lb), last menstrual period 02/05/1989, SpO2 100 %.   REVIEW OF SYSTEMS:  Review of Systems  Constitutional: Negative for fever, chills and malaise/fatigue.  HENT: Negative for sore throat.   Eyes: Negative for blurred vision.  Respiratory: Positive for shortness of breath (At baseline). Negative for cough, hemoptysis and wheezing.   Cardiovascular: Negative  for chest pain, palpitations and leg swelling.  Gastrointestinal: Negative for nausea, vomiting, abdominal pain, diarrhea and blood in stool.  Genitourinary: Negative for dysuria.  Musculoskeletal: Negative for back pain.  Neurological: Negative for dizziness, tremors and headaches.  Endo/Heme/Allergies: Does not bruise/bleed easily.     PHYSICAL EXAMINATION:  GENERAL:  79 y.o.-year-old patient lying in the bed with no acute distress.  NECK:  Supple, no jugular venous distention. No thyroid enlargement, no tenderness.  LUNGS: Normal breath sounds bilaterally, no wheezing, rales,rhonchi  No use of accessory muscles of respiration.  CARDIOVASCULAR: S1, S2 normal. No murmurs, rubs, or gallops.  ABDOMEN: Soft, non-tender, non-distended. Bowel sounds present. No organomegaly or mass.  EXTREMITIES: No pedal edema, cyanosis, or clubbing.  PSYCHIATRIC: The patient is alert and oriented x 3.  SKIN: No obvious rash, lesion, or ulcer.   DATA REVIEW:   CBC  Recent Labs Lab 02/04/15 0717  WBC 7.7  HGB 10.8*  HCT 34.7*  PLT 267    Chemistries   Recent Labs Lab 02/03/15 1422 02/04/15 0717  NA 138 140  K 3.5 3.9  CL 102 106  CO2 30 30  GLUCOSE 155* 215*  BUN 24* 23*  CREATININE 0.95 0.90  CALCIUM 8.3* 8.2*  AST 54*  --   ALT 65*  --   ALKPHOS 81  --   BILITOT 0.8  --     Cardiac Enzymes  Recent Labs Lab 02/03/15 1717 02/03/15 2351 02/04/15 0717  TROPONINI 0.04* 0.03 0.03    Microbiology Results  @[email or web address] @  RADIOLOGY:  Ct Chest W Contrast  02/03/2015  CLINICAL DATA:  79 year old female with history of cough and COPD. Cough for the past 6-7 months. Prior smoker for 30 years, quit 20 years ago. EXAM: CT CHEST WITH CONTRAST TECHNIQUE: Multidetector CT imaging of the chest was performed during intravenous contrast administration. CONTRAST:  66mL OMNIPAQUE IOHEXOL 300 MG/ML  SOLN COMPARISON:  PET-CT 10/29/2014. FINDINGS: Mediastinum/Lymph Nodes: Heart size is  enlarged with right ventricular and right atrial dilatation. There is no significant pericardial fluid, thickening or pericardial calcification. There is atherosclerosis of the thoracic aorta, the great vessels of the mediastinum and the coronary arteries, including calcified atherosclerotic plaque in the left main, left anterior descending, left circumflex and right coronary arteries. Pulmonic trunk is mildly dilated measuring 3.1 cm in diameter. Multiple borderline enlarged and mildly enlarged mediastinal lymph nodes, similar to prior examinations, measuring up to 13 mm in short axis in the low right paratracheal nodal station. Small hiatal hernia. No axillary lymphadenopathy. Lungs/Pleura: Mild diffuse bronchial wall thickening with moderate to severe centrilobular and mild paraseptal emphysema, with extensive bullous changes throughout the right middle lobe and lingula. No acute consolidative airspace disease. Trace right pleural effusion layering dependently. No suspicious appearing pulmonary nodules or masses. Linear areas of scarring are noted in the mid lungs bilaterally. Upper Abdomen: Atherosclerosis. Musculoskeletal/Soft Tissues: There are no aggressive appearing lytic or blastic lesions noted in the visualized portions of the skeleton. IMPRESSION: 1. No acute findings. 2. Diffuse bronchial thickening with moderate to severe centrilobular and mild paraseptal emphysema, including advanced bullous changes in the right middle lobe and lingula; imaging findings compatible with the reported clinical history of COPD. 3. Atherosclerosis, including left main and 3 vessel coronary artery disease. Assessment for potential risk factor modification, dietary therapy or pharmacologic therapy may be warranted, if clinically indicated. 4. Cardiomegaly with right ventricular and right atrial dilatation. 5. Mild dilatation of the pulmonic trunk (3.1 cm in diameter), suggestive of pulmonary arterial hypertension.  Electronically Signed   By: Vinnie Langton M.D.   On: 02/03/2015 15:08   Nm Pulmonary Perf And Vent  02/04/2015  CLINICAL DATA:  79 year old female with history of pulmonary hypertension. Evaluate for potential chronic thromboembolic disease. EXAM: NUCLEAR MEDICINE VENTILATION - PERFUSION LUNG SCAN TECHNIQUE: Ventilation images were obtained in multiple projections using inhaled aerosol Tc-45m DTPA. Perfusion images were obtained in multiple projections after intravenous injection of Tc-28m MAA. RADIOPHARMACEUTICALS:  32.9 Technetium-34m DTPA aerosol inhalation and 3.4 Technetium-78m MAA IV COMPARISON:  No priors. FINDINGS: Ventilation: Severe multifocal ventilation defects throughout the lungs bilaterally, related to underlying bullous emphysema. Extensive deposition of radiotracer centrally in lungs. Perfusion: Multiple matched defects in the lungs bilaterally, largest of which is in the region of the right middle lobe. No unmatched defects identified. IMPRESSION: 1. Today's study is extremely challenging to interpret with respect to the presence or absence of chronic thromboembolic disease given the severity of bullous emphysema and the associated ventilation abnormalities. However, given the fact that only matched perfusion/ventilation defects are noted,  and given that the recent chest CT demonstrated no central, lobar or segmental filling defects (assessed in retrospect), the likelihood of chronic thromboembolic disease in this patient is considered exceedingly low. Chronic pulmonary hypertension in this individual is far more likely related to their advanced bullous disease. 2. Today's study is considered very low probability (0-9%) for acute pulmonary embolism. Electronically Signed   By: Vinnie Langton M.D.   On: 02/04/2015 18:58   Dg Chest Port 1 View  02/04/2015  CLINICAL DATA:  Shortness of breath and wheezing today.  Ex-smoker. EXAM: PORTABLE CHEST 1 VIEW COMPARISON:  Previous examinations.  FINDINGS: The cardiac silhouette remains borderline enlarged. Stable linear scarring in the left mid lung zone. Small amount of linear scarring in the right mid lung zone. Otherwise, clear lungs. Tortuous and partially calcified thoracic aorta. Unremarkable bones. IMPRESSION: No acute abnormality. Electronically Signed   By: Claudie Revering M.D.   On: 02/04/2015 15:57      Management plans discussed with the patient and she is in agreement. Stable for discharge home  Patient should follow up with Dr Vella Kohler early next week  CODE STATUS:     Code Status Orders        Start     Ordered   02/03/15 1759  Full code   Continuous     02/03/15 1759    Advance Directive Documentation        Most Recent Value   Type of Advance Directive  Healthcare Power of Attorney   Pre-existing out of facility DNR order (yellow form or pink MOST form)     "MOST" Form in Place?        TOTAL TIME TAKING CARE OF THIS PATIENT: 35 minutes.    Note: This dictation was prepared with Dragon dictation along with smaller phrase technology. Any transcriptional errors that result from this process are unintentional.  Windle Huebert M.D on 02/04/2015 at 9:14 AM  Between 7am to 6pm - Pager - [phone] After 6pm go to [email or web address] - password [email or web address] [place] [facility]  Office  [phone]  CC: Primary care physician; Loistine Chance, MD

## 2015-02-05 NOTE — Progress Notes (Signed)
Spoke with Dr Benjie Karvonen about o2 sats down to 86% with exertion on 2L, new order 3L with exertion and 2L at rest, pt made aware and states understanding, ok to discharge pt

## 2015-02-05 NOTE — ED Notes (Signed)
Patient placed on 2L O2 via [place]. MD aware.  Patient states that she usually wears O2 at home

## 2015-02-05 NOTE — Care Management Note (Signed)
Case Management Note  Patient Details  Name: MORGANNA FUJITANI MRN: [id number] Date of Birth: 07/11/1935  Subjective/Objective:    No home health ordered. No orders for outpatient PT.                   Action/Plan:   Expected Discharge Date:                  Expected Discharge Plan:     In-House Referral:     Discharge planning Services     Post Acute Care Choice:    Choice offered to:     DME Arranged:    DME Agency:     HH Arranged:    [place] Agency:     Status of Service:     Medicare Important Message Given:    Date Medicare IM Given:    Medicare IM give by:    Date Additional Medicare IM Given:    Additional Medicare Important Message give by:     If discussed at [place] of Stay Meetings, dates discussed:    Additional Comments:  Jasia Hiltunen A, RN 01/15/2015, 8:45 AM

## 2015-02-05 NOTE — ED Notes (Signed)
Patient brought in by [facility] from [facility] for a syncopal episode. Patient was just discharged from inpatient unit about 2 hours ago, patient was admitted for CHF and COPD. Per EMS when they arrived patients initial O2 sats were in the 70's, sats improved to 94% with O2. Patient is c/o Nausea at this time.

## 2015-02-05 NOTE — Consult Note (Signed)
ANTIBIOTIC CONSULT NOTE - INITIAL  Pharmacy Consult for levofloxacin Indication: acute bronchitis  Allergies  Allergen Reactions  . Metformin Hcl Diarrhea  . Penicillins Hives    Patient Measurements: Height: 5' (152.4 cm) Weight: 107 lb 8 oz (48.762 kg) IBW/kg (Calculated) : 45.5 Adjusted Body Weight:   Vital Signs: Temp: 97.7 F (36.5 C) (12/31 1543) Temp Source: Oral (12/31 1543) BP: 89/65 mmHg (12/31 1942) Pulse Rate: 62 (12/31 1925) Intake/Output from previous day:   Intake/Output from this shift:    Labs:  Recent Labs  02/03/15 1422 02/04/15 0717 01/26/2015 1546  WBC 13.0* 7.7 14.1*  HGB 11.2* 10.8* 11.5*  PLT 288 267 348  CREATININE 0.95 0.90 0.99   Estimated Creatinine Clearance: 34.2 mL/min (by C-G formula based on Cr of 0.99). No results for input(s): VANCOTROUGH, VANCOPEAK, VANCORANDOM, GENTTROUGH, GENTPEAK, GENTRANDOM, TOBRATROUGH, TOBRAPEAK, TOBRARND, AMIKACINPEAK, AMIKACINTROU, AMIKACIN in the last 72 hours.   Microbiology: No results found for this or any previous visit (from the past 720 hour(s)).  Medical History: Past Medical History  Diagnosis Date  . Type II or unspecified type diabetes mellitus without mention of complication, not stated as uncontrolled   . Chronic airway obstruction, not elsewhere classified     on 2L o2 at home  . Congestive heart failure, unspecified   . Pure hypercholesterolemia   . Essential hypertension, benign   . Gouty arthropathy   . Coronary atherosclerosis of unspecified type of vessel, native or graft 2011  . Arthritis   . Hernia 2011  . Hypertension 1966  . Personal history of tobacco use, presenting hazards to health   . Breast screening, unspecified   . Mammographic microcalcification 2013  . Special screening for malignant neoplasms, colon   . Bowel trouble 1968  . Bronchiolitis 2014  . Pulmonary hypertension (HCC)     Medications:  Scheduled:  . insulin aspart  0-5 Units Subcutaneous QHS  .  [START ON 03-03-15] insulin aspart  0-9 Units Subcutaneous TID WC  . levofloxacin  250 mg Oral QPC supper   Assessment: Pt is a 79 year old female discharged earlier this AM with COPD exacerbation. Patient was brought back to hospital by EMS after 2 syncope episodes. Patient was on levofloxacin prior to d/c. Hospitalist would like to continue. Today is day 3 of antibiotics.   Goal of Therapy:  resolution of infection  Plan:  continue levofloxacin 250mg  po daily for 4 more doses to make a total of 7 days of tx Pharmacy to continue to monitor. Thank you for the consult   Ramond Dial 01/10/2015,7:44 PM

## 2015-02-05 NOTE — Progress Notes (Signed)
eLink Physician-Brief Progress Note Patient Name: Allison Francis DOB: 1935/12/17 MRN: [id number]   Date of Service  01/16/2015  HPI/Events of Note  Pt with copd admitted with syncope and sinus brady on monitor after ? Last dos of lopress and digoxin  eICU Interventions  No PCCM intervention needed. Beta blocker and digoxin on hold, will check dig level     Intervention Category Major Interventions: Arrhythmia - evaluation and management  Christinia Gully 02/03/2015, 9:20 PM

## 2015-02-05 NOTE — ED Notes (Signed)
Crash cart brought to bedside and pads applied

## 2015-02-05 NOTE — H&P (Addendum)
[facility] [place]    MR#:  [id number]  DATE OF BIRTH:  1935-11-14  DATE OF ADMISSION:  02/01/2015  PRIMARY CARE PHYSICIAN: Loistine Chance, MD   REQUESTING/REFERRING PHYSICIAN: Dr. Carrie Mew  CHIEF COMPLAINT:   Chief Complaint  Patient presents with  . Loss of Consciousness    HISTORY OF PRESENT ILLNESS:  Allison Francis  is a 79 y.o. female with a known history of diabetes mellitus, COPD on 2 L oxygen, diastolic CHF, pulmonary hypertension, arthritis, systemic hypertension presented to the hospital secondary to a syncopal episode at pharmacy store. Patient was just admitted to the hospital on 02/03/2015 and discharged this afternoon. She was here with COPD exacerbation and hypoxia. She was feeling fine and was discharged today on oral prednisone taper, on 2 L home oxygen which is her chronic home oxygen. She has significant bullous emphysema on her CT chest. VQ scan done was negative prior to discharge. Patient went to [facility] to pick up her prednisone taper, as she walked into the store she felt lightheaded and had to sit down. She passed out while she was sitting down. She woke up immediately and had another syncopal episode and remembers waking up on the floor. Denies any seizure-like activity, no bowel or bladder incontinence noted during that time. EMS was called and patient was brought to the emergency room. Patient denies any chest pain, shortness of breath, no headache or tingling numbness or vision changes prior to this. She felt back to normal last-she was in the emergency room. She received some fluids here, when the ER physician felt that she can be discharged home, her blood pressure dropped and her heart rate went into the 30s and patient felt presyncopal again. She received 0.5 mg of atropine and her heart rate improved to 60s. She is on digoxin and also metoprolol as outpatient. Patient denies any  prior episodes of bradycardia or syncope in the past. Blood pressure improved with fluids. Patient is being admitted for her hypotension and bradycardia.    Past Medical History  Diagnosis Date  . Type II or unspecified type diabetes mellitus without mention of complication, not stated as uncontrolled   . Chronic airway obstruction, not elsewhere classified     on 2L o2 at home  . Congestive heart failure, unspecified   . Pure hypercholesterolemia   . Essential hypertension, benign   . Gouty arthropathy   . Coronary atherosclerosis of unspecified type of vessel, native or graft 2011  . Arthritis   . Hernia 2011  . Hypertension 1966  . Personal history of tobacco use, presenting hazards to health   . Breast screening, unspecified   . Mammographic microcalcification 2013  . Special screening for malignant neoplasms, colon   . Bowel trouble 1968  . Bronchiolitis 2014  . Pulmonary hypertension ([facility])     PAST SURGICAL HISTORY:   Past Surgical History  Procedure Laterality Date  . Cardiac catheterization  01-02-00    cardiac stent, RCA  . Hernia repair  2011  . Coronary angioplasty with stent placement  2011  . Colonoscopy  2005  . Breast biopsy Right     neg    SOCIAL HISTORY:   Social History  Substance Use Topics  . Smoking status: Former Smoker -- 1.00 packs/day for 35 years    Types: Cigarettes    Quit date: 02/06/1996  . Smokeless tobacco: Never Used     Comment:  quit 25 years ago  . Alcohol Use: 3.6 oz/week    6 Standard drinks or equivalent per week     Comment: occasional wine    FAMILY HISTORY:   Family History  Problem Relation Age of Onset  . Multiple myeloma Mother   . Heart attack Mother   . Breast cancer Mother 31  . Diabetes Mother   . Hypertension      fx    DRUG ALLERGIES:   Allergies  Allergen Reactions  . Metformin Hcl Diarrhea  . Penicillins Hives    REVIEW OF SYSTEMS:   Review of Systems  Constitutional: Positive for  malaise/fatigue. Negative for fever, chills and weight loss.  HENT: Negative for ear discharge, ear pain, nosebleeds and tinnitus.   Eyes: Negative for blurred vision, double vision and photophobia.  Respiratory: Positive for shortness of breath. Negative for cough, hemoptysis and wheezing.   Cardiovascular: Negative for chest pain, palpitations, orthopnea and leg swelling.  Gastrointestinal: Negative for heartburn, nausea, vomiting, abdominal pain, diarrhea, constipation and melena.  Genitourinary: Negative for dysuria, urgency, frequency and hematuria.  Musculoskeletal: Negative for myalgias, back pain and neck pain.  Skin: Negative for rash.  Neurological: Positive for dizziness. Negative for tremors, sensory change, speech change, focal weakness and headaches.  Endo/Heme/Allergies: Does not bruise/bleed easily.  Psychiatric/Behavioral: Negative for depression.    MEDICATIONS AT HOME:   Prior to Admission medications   Medication Sig Start Date End Date Taking? Authorizing Provider  albuterol (PROAIR HFA) 108 (90 Base) MCG/ACT inhaler Inhale 2 puffs into the lungs every 6 (six) hours as needed for wheezing or shortness of breath.  09/08/14 09/08/15 Yes Historical Provider, MD  allopurinol (ZYLOPRIM) 100 MG tablet Take 1 tablet (100 mg total) by mouth daily. Patient taking differently: Take 100 mg by mouth as needed.  08/17/14  Yes Steele Sizer, MD  amLODipine (NORVASC) 5 MG tablet Take 1 tablet (5 mg total) by mouth daily. 08/17/14  Yes Steele Sizer, MD  aspirin EC 81 MG tablet Take 81 mg by mouth daily.   Yes Historical Provider, MD  benzonatate (TESSALON) 100 MG capsule Take 100 mg by mouth 3 (three) times daily as needed for cough.   Yes Historical Provider, MD  digoxin (LANOXIN) 0.125 MG tablet Take 1 tablet by mouth daily. 08/11/14  Yes Historical Provider, MD  Fluticasone-Salmeterol (ADVAIR DISKUS) 250-50 MCG/DOSE AEPB Inhale 1 puff into the lungs every 12 (twelve) hours.   Yes  Historical Provider, MD  HYDROcodone-homatropine (HYCODAN) 5-1.5 MG/5ML syrup Take 5 mLs by mouth daily as needed for cough.  04/12/14  Yes Historical Provider, MD  lidocaine (LIDODERM) 5 % Apply 1 patch topically daily as needed. Remove & Discard patch within 12 hours or as directed by MD   Yes Historical Provider, MD  losartan (COZAAR) 100 MG tablet Take 1 tablet (100 mg total) by mouth daily. 08/17/14  Yes Steele Sizer, MD  metoprolol succinate (TOPROL-XL) 50 MG 24 hr tablet Take 1 tablet (50 mg total) by mouth daily. Take with or immediately following a meal. 08/17/14  Yes Steele Sizer, MD  mirtazapine (REMERON) 15 MG tablet TAKE 1 TABLET BY MOUTH IN THE EVENING FOR APPETITE 10/14/14  Yes Steele Sizer, MD  omeprazole (PRILOSEC) 20 MG capsule Take 1 capsule (20 mg total) by mouth daily. 08/17/14  Yes Steele Sizer, MD  prednisoLONE acetate (PRED FORTE) 1 % ophthalmic suspension PLACE 1 DROP INTO BOTH EYES EVERY DAY AS NEEDED 07/25/14  Yes Historical Provider, MD  predniSONE (DELTASONE)  10 MG tablet Take 1 tablet (10 mg total) by mouth daily with breakfast. 60 mg PO (oral)  x 2 days 50 mg PO x 2 days 40 mg PO x 2 days 30 mg PO x 2 days 20 mg PO x 2 days 10 mg PO x 2 days then stop 02/04/2015  Yes Bettey Costa, MD      VITAL SIGNS:  Blood pressure 90/61, pulse 64, temperature 97.7 F (36.5 C), temperature source Oral, resp. rate 24, height 5' (1.524 m), weight 48.762 kg (107 lb 8 oz), last menstrual period 02/05/1989, SpO2 91 %.  PHYSICAL EXAMINATION:   Physical Exam  GENERAL: 79 y.o.-year-old thin, pale appearing, patient lying in the bed and not in any distress.  EYES: Pupils equal, post-surgical, round, reactive to light and accommodation. No scleral icterus. Extraocular muscles intact.  HEENT: Head atraumatic, normocephalic. Oropharynx and nasopharynx clear.  NECK: Supple, no jugular venous distention. No thyroid enlargement, no tenderness.  LUNGS: scant breath sounds bilaterally,  occasional scattered wheezing noted, no rales,rhonchi or crepitation. No use of accessory muscles of respiration.  CARDIOVASCULAR: S1, S2 normal. No rubs, or gallops. 3/6 systolic murmur present. ABDOMEN: Soft, nontender, nondistended. Bowel sounds present. No organomegaly or mass.  EXTREMITIES: No pedal edema, cyanosis, or clubbing.  NEUROLOGIC: Cranial nerves II through XII are intact. Muscle strength 5/5 in all extremities. Sensation intact. Gait not checked.  PSYCHIATRIC: The patient is alert and oriented x 3.  SKIN: No obvious rash, lesion, or ulcer.   LABORATORY PANEL:   CBC  Recent Labs Lab 01/12/2015 1546  WBC 14.1*  HGB 11.5*  HCT 38.5  PLT 348   ------------------------------------------------------------------------------------------------------------------  Chemistries   Recent Labs Lab 02/03/15 1422  02/03/2015 1546  NA 138  < > 139  K 3.5  < > 4.8  CL 102  < > 103  CO2 30  < > 27  GLUCOSE 155*  < > 248*  BUN 24*  < > 30*  CREATININE 0.95  < > 0.99  CALCIUM 8.3*  < > 8.6*  AST 54*  --   --   ALT 65*  --   --   ALKPHOS 81  --   --   BILITOT 0.8  --   --   < > = values in this interval not displayed. ------------------------------------------------------------------------------------------------------------------  Cardiac Enzymes  Recent Labs Lab 01/22/2015 1546  TROPONINI 0.03   ------------------------------------------------------------------------------------------------------------------  RADIOLOGY:  Nm Pulmonary Perf And Vent  02/04/2015  CLINICAL DATA:  79 year old female with history of pulmonary hypertension. Evaluate for potential chronic thromboembolic disease. EXAM: NUCLEAR MEDICINE VENTILATION - PERFUSION LUNG SCAN TECHNIQUE: Ventilation images were obtained in multiple projections using inhaled aerosol Tc-47mDTPA. Perfusion images were obtained in multiple projections after intravenous injection of Tc-975mAA. RADIOPHARMACEUTICALS:   32.9 Technetium-992mPA aerosol inhalation and 3.4 Technetium-37m98m IV COMPARISON:  No priors. FINDINGS: Ventilation: Severe multifocal ventilation defects throughout the lungs bilaterally, related to underlying bullous emphysema. Extensive deposition of radiotracer centrally in lungs. Perfusion: Multiple matched defects in the lungs bilaterally, largest of which is in the region of the right middle lobe. No unmatched defects identified. IMPRESSION: 1. Today's study is extremely challenging to interpret with respect to the presence or absence of chronic thromboembolic disease given the severity of bullous emphysema and the associated ventilation abnormalities. However, given the fact that only matched perfusion/ventilation defects are noted, and given that the recent chest CT demonstrated no central, lobar or segmental filling defects (assessed in retrospect), the likelihood  of chronic thromboembolic disease in this patient is considered exceedingly low. Chronic pulmonary hypertension in this individual is far more likely related to their advanced bullous disease. 2. Today's study is considered very low probability (0-9%) for acute pulmonary embolism. Electronically Signed   By: Vinnie Langton M.D.   On: 02/04/2015 18:58   Dg Chest Port 1 View  02/04/2015  CLINICAL DATA:  Shortness of breath and wheezing today.  Ex-smoker. EXAM: PORTABLE CHEST 1 VIEW COMPARISON:  Previous examinations. FINDINGS: The cardiac silhouette remains borderline enlarged. Stable linear scarring in the left mid lung zone. Small amount of linear scarring in the right mid lung zone. Otherwise, clear lungs. Tortuous and partially calcified thoracic aorta. Unremarkable bones. IMPRESSION: No acute abnormality. Electronically Signed   By: Claudie Revering M.D.   On: 02/04/2015 15:57    EKG:   Orders placed or performed during the hospital encounter of 01/21/2015  . ED EKG  . ED EKG  . EKG 12-Lead  . EKG 12-Lead  . EKG 12-Lead  . EKG  12-Lead    IMPRESSION AND PLAN:   Allison Francis  is a 79 y.o. female with a known history of diabetes mellitus, COPD on 2 L oxygen, diastolic CHF, pulmonary hypertension, arthritis, systemic hypertension presented to the hospital secondary to a syncopal episode at pharmacy store.  #1 bradycardia-sinus bradycardia, no type II or complete heart block noted. -Improved with atropine IV. -Monitor in step down unit, continue pacer pads, hold Toprol and digoxin. -Echocardiogram just done yesterday showing normal ejection fraction of 68%, diastolic dysfunction noted and severely elevated pulmonary artery pressures  -cardiology consulted. - Holter monitor did discharge up to cardiologist's discretion   #2 hypotension-may be hypovolemic. Improved with IV fluids. IV fluids as needed- another episode of dizziness in ER and her BP dropped- complaining of abdominal pain CT abdomen ordered -Hold Norvasc, losartan and also Toprol.  #3 recent acute COPD exacerbation with bronchitis-CT of the chest without any significant findings. -Continue Solu-Medrol while inpatient, that will also help her blood pressure. -Continue nebulizer treatments and inhalers. -Patient on Levaquin for bronchitis, will continue that. -Saturations are improved now and she is back on 2 L oxygen.   #4 history of paroxysmal atrial fibrillation- became bradycardic, was sinus bradycardia. In normal sinus rhythm now. -Hold digoxin and metoprolol  #5 depression-continue Remeron  #6 DVT prophylaxis-Lovenox   All the records are reviewed and case discussed with ED provider. Management plans discussed with the patient, family and they are in agreement.  CODE STATUS: Full code  TOTAL CRITICAL CARE TIME SPENT IN TAKING CARE OF THIS PATIENT: 60 minutes.    Gladstone Lighter M.D on 02/04/2015 at 7:09 PM  Between 7am to 6pm - Pager - [phone]  After 6pm go to [email or web address] - password [email or web address] [facility]   Office  [phone]  CC: Primary care physician; Loistine Chance, MD

## 2015-02-05 NOTE — Progress Notes (Signed)
Informed by nursing staff - patient bradycardic episode HR 40s, unresponsive during that time Evaluated at bedside Agitated/confused -- now back at baseline   EKG: anteriolateral ST depression - new-onset , only 1 lead ST elevation -- NOT STEMI -ASA, Statin, Tele, Trop

## 2015-02-05 NOTE — ED Provider Notes (Signed)
[facility] Emergency Department Provider Note  ____________________________________________  Time seen: 3:40 PM on arrival by EMS  I have reviewed the triage vital signs and the nursing notes.   HISTORY  Chief Complaint Loss of Consciousness    HPI Allison Francis is a 79 y.o. female was recently hospitalized for a COPD exacerbation. 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 2 days ago she was feeling more short of breath and had lower oxygen level so she was hospitalized for she received antibiotic steroids and bronchodilator treatments and feels much better. She now feels that her breathing is back to normal. She was discharged from the hospital about 11:00 AM today, and when she went to the pharmacy to pick up some prescriptions, she walked inside from the car and felt lightheaded. She sat in a chair at the pharmacy, and felt better but then when she got up again and she subsequently passed out. She reports losing consciousness and waking up on the floor. She denies any preceding pain or shortness of breath, no headache numbness tingling weakness vision changes chest pain abdominal pain or back pain. No symptoms afterwards either. She currently feels at baseline while sitting in the treatment bed. Denies any urinary symptoms.     Past Medical History  Diagnosis Date  . Type II or unspecified type diabetes mellitus without mention of complication, not stated as uncontrolled   . Chronic airway obstruction, not elsewhere classified     on 2L o2 at home  . Congestive heart failure, unspecified   . Pure hypercholesterolemia   . Essential hypertension, benign   . Gouty arthropathy   . Coronary atherosclerosis of unspecified type of vessel, native or graft 2011  . Arthritis   . Hernia 2011  . Hypertension 1966  . Personal history of tobacco use, presenting hazards to health   . Breast screening, unspecified   .  Mammographic microcalcification 2013  . Special screening for malignant neoplasms, colon   . Bowel trouble 1968  . Bronchiolitis 2014  . Pulmonary hypertension [facility])      Patient Active Problem List   Diagnosis Date Noted  . COPD (chronic obstructive pulmonary disease) ([place]) 02/03/2015  . Chronic iritis of both eyes 08/17/2014  . Osteoporosis 08/17/2014  . Insomnia 08/17/2014  . IBS (irritable bowel syndrome) 08/17/2014  . Lump or mass in breast 11/04/2012  . Acid reflux 08/14/2012  . Diet-controlled diabetes mellitus ([place]) 08/14/2012  . Cardiomyopathy ([place]) 08/13/2012  . A-fib ([place]) 08/13/2012  . Controlled gout 08/13/2012  . CAD (coronary artery disease) 03/27/2011  . Smoking history 03/27/2011  . COPD, severe ([place]) 03/27/2011  . Systolic CHF ([place]) 60/11/9321  . HTN (hypertension) 03/27/2011     Past Surgical History  Procedure Laterality Date  . Cardiac catheterization  01-02-00    cardiac stent, RCA  . Hernia repair  2011  . Coronary angioplasty with stent placement  2011  . Colonoscopy  2005  . Breast biopsy Right     neg     Current Outpatient Rx  Name  Route  Sig  Dispense  Refill  . albuterol (PROAIR HFA) 108 (90 Base) MCG/ACT inhaler   Inhalation   Inhale 2 puffs into the lungs every 6 (six) hours as needed.         Marland Kitchen allopurinol (ZYLOPRIM) 100 MG tablet   Oral   Take 1 tablet (100 mg total) by mouth daily. Patient taking differently:  Take 100 mg by mouth as needed.    30 tablet   5   . amLODipine (NORVASC) 5 MG tablet   Oral   Take 1 tablet (5 mg total) by mouth daily.   30 tablet   5   . aspirin EC 81 MG tablet   Oral   Take 81 mg by mouth daily.         . benzonatate (TESSALON) 100 MG capsule   Oral   Take 100 mg by mouth 3 (three) times daily as needed for cough.         . digoxin (LANOXIN) 0.125 MG tablet   Oral   Take 1 tablet by mouth daily.      2   . Fluticasone-Salmeterol (ADVAIR DISKUS) 250-50 MCG/DOSE AEPB    Inhalation   Inhale 1 puff into the lungs every 12 (twelve) hours.         Marland Kitchen HYDROcodone-homatropine (HYCODAN) 5-1.5 MG/5ML syrup   Oral   Take by mouth as needed.         . lidocaine (LIDODERM) 5 %   Topical   Apply 1 patch topically daily as needed. Remove & Discard patch within 12 hours or as directed by MD         . losartan (COZAAR) 100 MG tablet   Oral   Take 1 tablet (100 mg total) by mouth daily.   30 tablet   5   . metoprolol succinate (TOPROL-XL) 50 MG 24 hr tablet   Oral   Take 1 tablet (50 mg total) by mouth daily. Take with or immediately following a meal.   30 tablet   5   . mirtazapine (REMERON) 15 MG tablet      TAKE 1 TABLET BY MOUTH IN THE EVENING FOR APPETITE   90 tablet   1   . omeprazole (PRILOSEC) 20 MG capsule   Oral   Take 1 capsule (20 mg total) by mouth daily.   30 capsule   5   . prednisoLONE acetate (PRED FORTE) 1 % ophthalmic suspension      PLACE 1 DROP INTO BOTH EYES EVERY DAY AS NEEDED      3   . predniSONE (DELTASONE) 10 MG tablet   Oral   Take 1 tablet (10 mg total) by mouth daily with breakfast. 60 mg PO (oral)  x 2 days 50 mg PO x 2 days 40 mg PO x 2 days 30 mg PO x 2 days 20 mg PO x 2 days 10 mg PO x 2 days then stop   30 tablet   0     Label  & dispense according to the schedule below: ...      Allergies Metformin hcl and Penicillins   Family History  Problem Relation Age of Onset  . Multiple myeloma Mother   . Heart attack Mother   . Breast cancer Mother 43  . Diabetes Mother   . Hypertension      fx    Social History Social History  Substance Use Topics  . Smoking status: Former Smoker -- 1.00 packs/day for 35 years    Types: Cigarettes    Quit date: 02/06/1996  . Smokeless tobacco: Never Used     Comment: quit 25 years ago  . Alcohol Use: 3.6 oz/week    6 Standard drinks or equivalent per week     Comment: occasional wine    Review of Systems  Constitutional:   No fever or chills.  No  weight changes Eyes:   No blurry vision or double vision.  ENT:   No sore throat. Cardiovascular:   No chest pain. Respiratory:   No dyspnea or cough. Gastrointestinal:   Negative for abdominal pain, vomiting and diarrhea.  No BRBPR or melena. Genitourinary:   Negative for dysuria, urinary retention, bloody urine, or difficulty urinating. Musculoskeletal:   Negative for back pain. No joint swelling or pain. Skin:   Negative for rash. Neurological:   Negative for headaches, focal weakness or numbness. Positive lightheadedness Psychiatric:  No anxiety or depression.   Endocrine:  No hot/cold intolerance, changes in energy, or sleep difficulty.  10-point ROS otherwise negative.  ____________________________________________   PHYSICAL EXAM:  VITAL SIGNS: ED Triage Vitals  Enc Vitals Group     BP 01/12/2015 1543 129/98 mmHg     Pulse Rate 01/22/2015 1543 72     Resp 01/15/2015 1543 17     Temp 01/21/2015 1543 97.7 F (36.5 C)     Temp Source 02/04/2015 1543 Oral     SpO2 01/16/2015 1543 94 %     Weight 01/26/2015 1543 107 lb 8 oz (48.762 kg)     Height 01/13/2015 1543 5' (1.524 m)     Head Cir --      Peak Flow --      Pain Score 01/15/2015 1550 0     Pain Loc --      Pain Edu? --      Excl. in [place]? --     Vital signs reviewed, nursing assessments reviewed.   Constitutional:   Alert and oriented. Well appearing and in no distress. Eyes:   No scleral icterus. No conjunctival pallor. PERRL. EOMI ENT   Head:   Normocephalic and atraumatic.   Nose:   No congestion/rhinnorhea. No septal hematoma   Mouth/Throat:   MMM, no pharyngeal erythema. No peritonsillar mass. No uvula shift.   Neck:   No stridor. No SubQ emphysema. No meningismus. Hematological/Lymphatic/Immunilogical:   No cervical lymphadenopathy. Cardiovascular:   RRR. Normal and symmetric distal pulses are present in all extremities. No murmurs, rubs, or gallops. Respiratory:   Normal respiratory effort without tachypnea  nor retractions. Breath sounds are clear and equal bilaterally. No wheezes/rales/rhonchi. Gastrointestinal:   Soft and nontender. No distention. There is no CVA tenderness.  No rebound, rigidity, or guarding. Genitourinary:   deferred Musculoskeletal:   Nontender with normal range of motion in all extremities. No joint effusions.  No lower extremity tenderness.  No edema. Neurologic:   Normal speech and language.  CN 2-10 normal. Motor grossly intact. No gross focal neurologic deficits are appreciated.  Skin:    Skin is warm, dry and intact. No rash noted.  No petechiae, purpura, or bullae. Poor skin turgor Psychiatric:   Mood and affect are normal. Speech and behavior are normal. Patient exhibits appropriate insight and judgment.  ____________________________________________    LABS (pertinent positives/negatives) (all labs ordered are listed, but only abnormal results are displayed) Labs Reviewed  URINALYSIS COMPLETEWITH MICROSCOPIC ([facility] ONLY) - Abnormal; Notable for the following:    Color, Urine YELLOW (*)    APPearance CLEAR (*)    Glucose, UA 50 (*)    Protein, ur >500 (*)    Bacteria, UA RARE (*)    Squamous Epithelial / LPF 0-5 (*)    All other components within normal limits  CBC WITH DIFFERENTIAL/PLATELET - Abnormal; Notable for the following:    WBC 14.1 (*)    RBC 5.32 (*)  Hemoglobin 11.5 (*)    MCV 72.4 (*)    MCH 21.5 (*)    MCHC 29.7 (*)    RDW 17.8 (*)    Neutro Abs 12.8 (*)    Lymphs Abs 0.7 (*)    All other components within normal limits  BASIC METABOLIC PANEL - Abnormal; Notable for the following:    Glucose, Bld 248 (*)    BUN 30 (*)    Calcium 8.6 (*)    GFR calc non Af Amer 54 (*)    All other components within normal limits  URINE CULTURE  TROPONIN I   ____________________________________________   EKG  Interpreted by me Normal sinus rhythm rate of 72, normal axis intervals and QRS. No acute ischemic  changes.  ____________________________________________    RADIOLOGY    ____________________________________________   PROCEDURES CRITICAL CARE Performed by: Carrie Mew   Total critical care time: 35 minutes  Critical care time was exclusive of separately billable procedures and treating other patients.  Critical care was necessary to treat or prevent imminent or life-threatening deterioration.  Critical care was time spent personally by me on the following activities: development of treatment plan with patient and/or surrogate as well as nursing, discussions with consultants, evaluation of patient's response to treatment, examination of patient, obtaining history from patient or surrogate, ordering and performing treatments and interventions, ordering and review of laboratory studies, ordering and review of radiographic studies, pulse oximetry and re-evaluation of patient's condition.   ____________________________________________   INITIAL IMPRESSION / ASSESSMENT AND PLAN / ED COURSE  Pertinent labs & imaging results that were available during my care of the patient were reviewed by me and considered in my medical decision making (see chart for details).  Patient presents with syncope upon standing which was preceded by lightheadedness with walking, likely orthostatic. She does report that she's not had good oral intake recently due to her respiratory illness. The respiratory condition appears to be back to baseline and she feels much better in that regard. Oxygen saturation is 88% on room air, she normally uses 2 L at home. Other vital signs are unremarkable. Exam is reassuring and does not reveal any other acute findings except for suggestion of dehydration. We'll give her fluids while checking labs and a urinalysis. ----------------------------------------- 6:38 PM on 01/24/2015 ----------------------------------------- Initial workup unremarkable and vital signs  stable in the emergency department. Patient felt at baseline, labs suggestive of mild hemoconcentration on the blood counts and slightly elevated BUN consistent with dehydration. After giving the patient a liter of IV fluids, we did a trial of ambulation. Shortly after standing up and beginning to walk the patient said that she felt lightheaded like she was going to pass out and that her vision was getting blurry. She was placed back in the bed where she was found to have a heart rate of 40 and a blood pressure of 62/47. The patient denied any chest pain or shortness of breath and only complained of the dizziness. She remained awake and alert. She was given IV atropine with normalization of her heart rate into the 70-90 range and normalization of her blood pressure. Pacer pads are placed on the patient but she does not require pacing at this time. We'll continue monitoring her vital signs with telemetry. I discussed the case with the hospitalist for admission.     ____________________________________________   FINAL CLINICAL IMPRESSION(S) / ED DIAGNOSES  Final diagnoses:  Hypotension, postural  Symptomatic sinus bradycardia      Doren Custard  Joni Fears, MD 01/14/2015 [phone]

## 2015-02-05 NOTE — Progress Notes (Signed)
Pt being discharged home at his time, discharge and prescription reviewed with pt, states understanding, o2 in place, states understanding to increase o2 to 3L [place] with exertion and 2L [place] at rest, pt to follow up with pulmonology and PCP, pt with no complaints of pain, no distress or discomfort noted

## 2015-02-05 NOTE — ED Provider Notes (Signed)
-----------------------------------------   7:22 PM on 01/19/2015 -----------------------------------------  Patient had recurrent episode of hypotension with a blood pressure of about 60/40. Heart rate remained in the 60s during this time. Patient states that she only felt a little bit dizzy but had just before been having some epigastric pain. She reports that she is still having epigastric pain now. She does have some prominent aortic pulsations although I don't think that this is a pulsatile mass. We'll get a CT of her abdomen pelvis to evaluate.  Also get an ABG to help discern her arterial oxygenation to determine if hypoxia is potentially contributing to cardiac instability, as the peripheral pulse oximeter is having difficulty providing consistent measurements.  Carrie Mew, MD 01/16/2015 [phone]

## 2015-02-05 NOTE — ED Notes (Signed)
Pt's BP dropped to 69/54, retook pressure on opposite arm - BP 66/43. MD [facility] notified. MD Joni Fears came to bedside, evaluated patient. Pt's pressure increased to 95/53.

## 2015-02-06 ENCOUNTER — Encounter: Payer: Self-pay | Admitting: Family Medicine

## 2015-02-06 ENCOUNTER — Inpatient Hospital Stay: Payer: Medicare Other

## 2015-02-06 LAB — TROPONIN I
TROPONIN I: 0.31 ng/mL — AB (ref <0.031)
TROPONIN I: 1.57 ng/mL — AB (ref <0.031)
TROPONIN I: 3.5 ng/mL — AB (ref <0.031)

## 2015-02-06 LAB — BLOOD GAS, ARTERIAL
ACID-BASE DEFICIT: 3.1 mmol/L — AB (ref 0.0–2.0)
ACID-BASE DEFICIT: 8.9 mmol/L — AB (ref 0.0–2.0)
BICARBONATE: 20 meq/L — AB (ref 21.0–28.0)
Bicarbonate: 24 mEq/L (ref 21.0–28.0)
DELIVERY SYSTEMS: POSITIVE
Expiratory PAP: 5
FIO2: 0.4
FIO2: 1
INSPIRATORY PAP: 10
MECHANICAL RATE: 12
O2 Saturation: 97.1 %
O2 Saturation: 99.5 %
PATIENT TEMPERATURE: 37
PCO2 ART: 50 mmHg — AB (ref 32.0–48.0)
PH ART: 7.29 — AB (ref 7.350–7.450)
Patient temperature: 37
pCO2 arterial: 56 mmHg — ABNORMAL HIGH (ref 32.0–48.0)
pH, Arterial: 7.16 — CL (ref 7.350–7.450)
pO2, Arterial: 102 mmHg (ref 83.0–108.0)
pO2, Arterial: 206 mmHg — ABNORMAL HIGH (ref 83.0–108.0)

## 2015-02-06 LAB — BASIC METABOLIC PANEL
ANION GAP: 9 (ref 5–15)
Anion gap: 5 (ref 5–15)
BUN: 34 mg/dL — ABNORMAL HIGH (ref 6–20)
BUN: 36 mg/dL — AB (ref 6–20)
CALCIUM: 7.4 mg/dL — AB (ref 8.9–10.3)
CALCIUM: 7.5 mg/dL — AB (ref 8.9–10.3)
CO2: 21 mmol/L — AB (ref 22–32)
CO2: 25 mmol/L (ref 22–32)
CREATININE: 1.21 mg/dL — AB (ref 0.44–1.00)
Chloride: 109 mmol/L (ref 101–111)
Chloride: 110 mmol/L (ref 101–111)
Creatinine, Ser: 1.22 mg/dL — ABNORMAL HIGH (ref 0.44–1.00)
GFR calc Af Amer: 48 mL/min — ABNORMAL LOW (ref >60)
GFR, EST AFRICAN AMERICAN: 49 mL/min — AB (ref >60)
GFR, EST NON AFRICAN AMERICAN: 42 mL/min — AB (ref >60)
GFR, EST NON AFRICAN AMERICAN: 42 mL/min — AB (ref >60)
GLUCOSE: 126 mg/dL — AB (ref 65–99)
Glucose, Bld: 183 mg/dL — ABNORMAL HIGH (ref 65–99)
Potassium: 4.9 mmol/L (ref 3.5–5.1)
Potassium: 5.2 mmol/L — ABNORMAL HIGH (ref 3.5–5.1)
SODIUM: 139 mmol/L (ref 135–145)
Sodium: 140 mmol/L (ref 135–145)

## 2015-02-06 LAB — CBC
HCT: 36.7 % (ref 35.0–47.0)
Hemoglobin: 11.1 g/dL — ABNORMAL LOW (ref 12.0–16.0)
MCH: 22.2 pg — ABNORMAL LOW (ref 26.0–34.0)
MCHC: 30.3 g/dL — AB (ref 32.0–36.0)
MCV: 73.4 fL — ABNORMAL LOW (ref 80.0–100.0)
PLATELETS: 249 10*3/uL (ref 150–440)
RBC: 5.01 MIL/uL (ref 3.80–5.20)
RDW: 17.8 % — AB (ref 11.5–14.5)
WBC: 11.1 10*3/uL — ABNORMAL HIGH (ref 3.6–11.0)

## 2015-02-06 LAB — GLUCOSE, CAPILLARY
GLUCOSE-CAPILLARY: 181 mg/dL — AB (ref 65–99)
Glucose-Capillary: 95 mg/dL (ref 65–99)
Glucose-Capillary: 199 mg/dL — ABNORMAL HIGH (ref 65–99)

## 2015-02-06 LAB — LACTIC ACID, PLASMA
LACTIC ACID, VENOUS: 1.4 mmol/L (ref 0.5–2.0)
LACTIC ACID, VENOUS: 3.5 mmol/L — AB (ref 0.5–2.0)

## 2015-02-06 MED ORDER — SODIUM CHLORIDE 0.9 % IV BOLUS (SEPSIS)
1000.0000 mL | Freq: Once | INTRAVENOUS | Status: AC
  Administered 2015-02-06: 1000 mL via INTRAVENOUS

## 2015-02-06 MED ORDER — HEPARIN (PORCINE) IN NACL 100-0.45 UNIT/ML-% IJ SOLN
600.0000 [IU]/h | INTRAMUSCULAR | Status: DC
  Administered 2015-02-06: 600 [IU]/h via INTRAVENOUS
  Filled 2015-02-06: qty 250

## 2015-02-06 MED ORDER — HEPARIN BOLUS VIA INFUSION
2900.0000 [IU] | Freq: Once | INTRAVENOUS | Status: DC
  Filled 2015-02-06: qty 2900

## 2015-02-06 MED ORDER — CETYLPYRIDINIUM CHLORIDE 0.05 % MT LIQD
7.0000 mL | Freq: Two times a day (BID) | OROMUCOSAL | Status: DC
  Administered 2015-02-06 (×2): 7 mL via OROMUCOSAL

## 2015-02-06 MED ORDER — ENOXAPARIN SODIUM 60 MG/0.6ML ~~LOC~~ SOLN
1.0000 mg/kg | SUBCUTANEOUS | Status: DC
  Administered 2015-02-06: 50 mg via SUBCUTANEOUS
  Filled 2015-02-06 (×3): qty 0.6

## 2015-02-06 MED ORDER — IPRATROPIUM-ALBUTEROL 0.5-2.5 (3) MG/3ML IN SOLN: 3.0000 mL | RESPIRATORY_TRACT | Status: DC | PRN

## 2015-02-06 DEATH — deceased

## 2015-02-07 LAB — URINE CULTURE: Culture: NO GROWTH

## 2015-02-07 MED FILL — Medication: Qty: 1 | Status: AC

## 2015-02-10 LAB — CULTURE, BLOOD (ROUTINE X 2): Culture: NO GROWTH

## 2015-02-11 LAB — CULTURE, BLOOD (ROUTINE X 2): CULTURE: NO GROWTH

## 2015-02-22 ENCOUNTER — Ambulatory Visit: Payer: Medicare Other | Admitting: Family Medicine

## 2015-03-09 NOTE — ED Provider Notes (Signed)
-----------------------------------------   8:35 PM on 16-Feb-2015 -----------------------------------------  Code Blue was called to which I responded as did Dr. Earleen Newport. On arrival, CPR was in progress. The report was that the patient had experienced severe bradycardia after which she lost pulses. I did intubate her and place an intraosseous line as her IV was not working. Dr. Earleen Newport ran the code primarily. Please see his dictated note for details.  INTUBATION Performed by: Loura Pardon A  Required items: required blood products, implants, devices, and special equipment available Patient identity confirmed: provided demographic data and hospital-assigned identification number Time out: Immediately prior to procedure a "time out" was called to verify the correct patient, procedure, equipment, support staff and site/side marked as required.  Indications: ventilation/oxygenation, cardiac arrest  Intubation method: Glidescope Laryngoscopy   Preoxygenation: BVM  Sedatives: none Paralytic: none  Tube Size: 7.5 cuffed  Post-procedure assessment: chest rise and auscultation and CO2 detector Breath sounds: equal and absent over the epigastrium Tube secured with:tape  Patient tolerated the procedure well with no immediate complications.     Joanne Gavel, MD 2015/02/16 2040

## 2015-03-09 NOTE — Progress Notes (Signed)
ABG: 7.16/56/20 Will check bmp, lactic acid - searching for metabolic etiology  Some component of CO2 retention - bipap

## 2015-03-09 NOTE — Progress Notes (Signed)
Pt requested to use the bathroom. This RN and Domingo Pulse, RN were assisting pt on bedpan when pt became less responsive and agitated. Pt was unresponsive for about 5 minutes and HR dropped to 46. A rhythm changed was also noted. Pt had a short run of ST elevation and a change to 1st degree HB. E-link was notified and Dr. Lavetta Nielsen was the responding physician. An EKG and ABG were ordered. Pt placed on non-rebreather. Haldol was also ordered and given. Dr. Vonzella Nipple for troponin levels and to give aspirin and Lipitor. Pt now answering questions appropriately, and resting. Will continue to monitor closely.

## 2015-03-09 NOTE — Discharge Summary (Signed)
Patient was pronounced deceased at 8:07 PM  This was a 80 year old female with PCI to the right coronary artery, severe pulmonary hypertension and COPD who initially presented after syncopal episode and found to have a sinus bradycardia.  1. Sinus bradycardia: Beta blocker and digoxin were on hold. Cardiology was consulted. Patient had CODE BLUE called on the evening of January 1. CPR was performed for over 40 minutes. She also at that time was intubated. Patient was pronounced deceased at 8:07 PM.  2. Syncope: Likely related to arrhythmia and not using O2 as prescribed.  3. Non-ST elevation MI: Patient was started on full dose anticoagulation and continued on aspirin. Beta blocker was held due to problem #1.  4. Acute on chronic hypercapnic respiratory failure: From COPD exacerbation  5. Type 2 diabetes  6. Paroxysmal atrial fibrillation

## 2015-03-09 NOTE — Plan of Care (Signed)
Spoke with Dr Ubaldo Glassing regarding patients troponin. He will be coming by to evaluate her. No new orders given at this time. patient is not having any pain or symptoms. Resting in bed.

## 2015-03-09 NOTE — Consult Note (Addendum)
[name]  CARDIOLOGY CONSULT NOTE  Patient ID: Allison Francis MRN: [id number] DOB/AGE: 04/19/1935 80 y.o.  Admit date: 01/14/2015 Referring Physician Dr. Benjie Karvonen Primary Physician Dr. Ancil Boozer Primary Cardiologist Dr. Clayborn Bigness Reason for Consultation syncope  HPI: Pt is a 80 yo female with history of pci of the rca in the past, history of copd who was admitted after suffering a syncopal episode while standing in line at the pharmacy to get her meds. She denied any chest pain or warning but felt dizzy and then fell on her buttocks. She was bradycardic ith heart rates in the 30's on presentantion and was on beta blockers intially She has history of afib treated with digoxin and the metoprolol EKG on admission was sinus bradycardia with imporvement to nsr at present. She has history of sever ulmonary hypertension by echo done durnig recent admission per [facility]. She is on home oxygen and wears it at night and with exertions. She was not wearing it at time of event. She is currently in nsr at 43 and without complaints. Serum troponin was o.o3 on presentation and subsequant value was 0.31 and 1.57. EKG shows no ischemia  ROS Review of Systems - History obtained from chart review and the patient General ROS: positive for  - syncope on presentation but asymptomatic at present Respiratory ROS: no cough, shortness of breath, or wheezing Cardiovascular ROS: no chest pain or dyspnea on exertion Gastrointestinal ROS: no abdominal pain, change in bowel habits, or black or bloody stools Musculoskeletal ROS: negative Neurological ROS: no TIA or stroke symptoms   Past Medical History  Diagnosis Date  . Type II or unspecified type diabetes mellitus without mention of complication, not stated as uncontrolled   . Chronic airway obstruction, not elsewhere classified     on 2L o2 at home  . Congestive heart failure, unspecified   . Pure hypercholesterolemia   . Essential  hypertension, benign   . Gouty arthropathy   . Coronary atherosclerosis of unspecified type of vessel, native or graft 2011  . Arthritis   . Hernia 2011  . Hypertension 1966  . Personal history of tobacco use, presenting hazards to health   . Breast screening, unspecified   . Mammographic microcalcification 2013  . Special screening for malignant neoplasms, colon   . Bowel trouble 1968  . Bronchiolitis 2014  . Pulmonary hypertension (HCC)     Family History  Problem Relation Age of Onset  . Multiple myeloma Mother   . Heart attack Mother   . Breast cancer Mother 83  . Diabetes Mother   . Hypertension      fx    Social History   Social History  . Marital Status: Widowed    Spouse Name: N/A  . Number of Children: N/A  . Years of Education: N/A   Occupational History  . Not on file.   Social History Main Topics  . Smoking status: Former Smoker -- 1.00 packs/day for 35 years    Types: Cigarettes    Quit date: 02/06/1996  . Smokeless tobacco: Never Used     Comment: quit 25 years ago  . Alcohol Use: 3.6 oz/week    6 Standard drinks or equivalent per week     Comment: occasional wine  . Drug Use: No  . Sexual Activity: Not Currently   Other Topics Concern  . Not on file   Social History Narrative   Lves at home by herself. Ambulates independantly, has chronic 2L  o2    Past Surgical History  Procedure Laterality Date  . Cardiac catheterization  01-02-00    cardiac stent, RCA  . Hernia repair  2011  . Coronary angioplasty with stent placement  2011  . Colonoscopy  2005  . Breast biopsy Right     neg     Prescriptions prior to admission  Medication Sig Dispense Refill Last Dose  . albuterol (PROAIR HFA) 108 (90 Base) MCG/ACT inhaler Inhale 2 puffs into the lungs every 6 (six) hours as needed for wheezing or shortness of breath.    prn  . allopurinol (ZYLOPRIM) 100 MG tablet Take 1 tablet (100 mg total) by mouth daily. (Patient taking differently: Take 100 mg  by mouth as needed. ) 30 tablet 5 unknown  . amLODipine (NORVASC) 5 MG tablet Take 1 tablet (5 mg total) by mouth daily. 30 tablet 5 unknown  . aspirin EC 81 MG tablet Take 81 mg by mouth daily.   unknown  . benzonatate (TESSALON) 100 MG capsule Take 100 mg by mouth 3 (three) times daily as needed for cough.   unknown  . digoxin (LANOXIN) 0.125 MG tablet Take 1 tablet by mouth daily.  2 unknown  . Fluticasone-Salmeterol (ADVAIR DISKUS) 250-50 MCG/DOSE AEPB Inhale 1 puff into the lungs every 12 (twelve) hours.   unknown  . HYDROcodone-homatropine (HYCODAN) 5-1.5 MG/5ML syrup Take 5 mLs by mouth daily as needed for cough.    unknown  . lidocaine (LIDODERM) 5 % Apply 1 patch topically daily as needed. Remove & Discard patch within 12 hours or as directed by MD   unknown  . losartan (COZAAR) 100 MG tablet Take 1 tablet (100 mg total) by mouth daily. 30 tablet 5 unknown  . metoprolol succinate (TOPROL-XL) 50 MG 24 hr tablet Take 1 tablet (50 mg total) by mouth daily. Take with or immediately following a meal. 30 tablet 5 unknown  . mirtazapine (REMERON) 15 MG tablet TAKE 1 TABLET BY MOUTH IN THE EVENING FOR APPETITE 90 tablet 1 unknown  . omeprazole (PRILOSEC) 20 MG capsule Take 1 capsule (20 mg total) by mouth daily. 30 capsule 5 unknown  . prednisoLONE acetate (PRED FORTE) 1 % ophthalmic suspension PLACE 1 DROP INTO BOTH EYES EVERY DAY AS NEEDED  3 unknown  . predniSONE (DELTASONE) 10 MG tablet Take 1 tablet (10 mg total) by mouth daily with breakfast. 60 mg PO (oral)  x 2 days 50 mg PO x 2 days 40 mg PO x 2 days 30 mg PO x 2 days 20 mg PO x 2 days 10 mg PO x 2 days then stop 30 tablet 0 unknown    Physical Exam: Blood pressure 96/56, pulse 73, temperature 98.4 F (36.9 C), temperature source Core (Comment), resp. rate 19, height 5' (1.524 m), weight 48.762 kg (107 lb 8 oz), last menstrual period 02/05/1989, SpO2 92 %.  General appearance: alert and cooperative Resp: rhonchi  bilaterally Cardio: regular rate and rhythm GI: soft, non-tender; bowel sounds normal; no masses,  no organomegaly Extremities: extremities normal, atraumatic, no cyanosis or edema Neurologic: Grossly normal Labs:   Lab Results  Component Value Date   WBC 11.1* 2015/02/11   HGB 11.1* 02/11/2015   HCT 36.7 2015/02/11   MCV 73.4* 11-Feb-2015   PLT 249 11-Feb-2015    Recent Labs Lab 02/03/15 1422  2015-02-11 0433  NA 138  < > 140  K 3.5  < > 4.9  CL 102  < > 110  CO2 30  < >  25  BUN 24*  < > 36*  CREATININE 0.95  < > 1.22*  CALCIUM 8.3*  < > 7.4*  PROT 7.0  --   --   BILITOT 0.8  --   --   ALKPHOS 81  --   --   ALT 65*  --   --   AST 54*  --   --   GLUCOSE 155*  < > 126*  < > = values in this interval not displayed. Lab Results  Component Value Date   CKMB 2.3 03/09/2012   TROPONINI 1.57* Feb 23, 2015      Radiology: no acute air space disease. No pe EKG: sinus bradycardia initially with no ischemia  ASSESSMENT AND PLAN:  80 yo female with history of cad s/p pci of rca in the past, history of severe pulmonary hypertension and oxygen dependent copd. Had syncopal episode while waiting for her meds at the pharmacy. Was on metoprolol and digoxin for paroxysmal afib. Currently in nsr. Etiology of syncoope unclear. Vaso vagal vs arhythmia. Troponin currently 1.57. No ischemia on ekg or chest pain. Will hold beta blockers and digoxin for now. Continue hydration as serum ccreatinine has increase. Carotids pending.  Continue asa. Follow troponin and symptoms. Will need holter as outpatient. Consdier further ishemic workup pending course and cardiac markers. OK to transfer to telemetry.  Signed: Teodoro Spray MD, [facility] February 23, 2015, 10:50 AM

## 2015-03-09 NOTE — Progress Notes (Addendum)
[facility] NAME: Allison Francis    MR#:  [id number]  DATE OF BIRTH:  Aug 08, 1935  SUBJECTIVE:   Patient doing well. Patient denies chest pain or shortness of breath.  REVIEW OF SYSTEMS:    Review of Systems  Constitutional: Negative for fever, chills and malaise/fatigue.  HENT: Negative for sore throat.   Eyes: Negative for blurred vision.  Respiratory: Negative for cough, hemoptysis, shortness of breath and wheezing.   Cardiovascular: Negative for chest pain, palpitations and leg swelling.  Gastrointestinal: Negative for nausea, vomiting, abdominal pain, diarrhea and blood in stool.  Genitourinary: Negative for dysuria.  Musculoskeletal: Negative for back pain.  Neurological: Negative for dizziness, tremors and headaches.  Endo/Heme/Allergies: Does not bruise/bleed easily.    Tolerating Diet: Nothing by mouth      DRUG ALLERGIES:   Allergies  Allergen Reactions  . Metformin Hcl Diarrhea  . Penicillins Hives    VITALS:  Blood pressure 104/60, pulse 69, temperature 98.4 F (36.9 C), temperature source Core (Comment), resp. rate 16, height 5' (1.524 m), weight 48.762 kg (107 lb 8 oz), last menstrual period 02/05/1989, SpO2 98 %.  PHYSICAL EXAMINATION:   Physical Exam  Constitutional: She is oriented to person, place, and time and well-developed, well-nourished, and in no distress. No distress.  HENT:  Head: Normocephalic.  Wearing BiPAP  Eyes: No scleral icterus.  Neck: Normal range of motion. Neck supple. No JVD present. No tracheal deviation present.  Cardiovascular: Normal rate, regular rhythm and normal heart sounds.  Exam reveals no gallop and no friction rub.   No murmur heard. Pulmonary/Chest: Effort normal and breath sounds normal. No respiratory distress. She has no wheezes. She has no rales. She exhibits no tenderness.  Abdominal: Soft. Bowel sounds are normal. She exhibits no distension and no  mass. There is no tenderness. There is no rebound and no guarding.  Musculoskeletal: Normal range of motion. She exhibits no edema.  Neurological: She is alert and oriented to person, place, and time.  Skin: Skin is warm. No rash noted. No erythema.  Psychiatric: Affect and judgment normal.      LABORATORY PANEL:   CBC  Recent Labs Lab Feb 12, 2015 0433  WBC 11.1*  HGB 11.1*  HCT 36.7  PLT 249   ------------------------------------------------------------------------------------------------------------------  Chemistries   Recent Labs Lab 02/03/15 1422  12-Feb-2015 0433  NA 138  < > 140  K 3.5  < > 4.9  CL 102  < > 110  CO2 30  < > 25  GLUCOSE 155*  < > 126*  BUN 24*  < > 36*  CREATININE 0.95  < > 1.22*  CALCIUM 8.3*  < > 7.4*  AST 54*  --   --   ALT 65*  --   --   ALKPHOS 81  --   --   BILITOT 0.8  --   --   < > = values in this interval not displayed. ------------------------------------------------------------------------------------------------------------------  Cardiac Enzymes  Recent Labs Lab 01/11/2015 1546 02/12/15 0020 02/12/2015 0602  TROPONINI 0.03 0.31* 1.57*   ------------------------------------------------------------------------------------------------------------------  RADIOLOGY:  Ct Angio Chest Pe W/cm &/or Wo Cm  01/06/2015  CLINICAL DATA:  80 year old female with shortness of breath, bradycardia, hypotension and epigastric pain EXAM: CT ANGIOGRAPHY CHEST, ABDOMEN AND PELVIS TECHNIQUE: Multidetector CT imaging through the chest, abdomen and pelvis was performed using the standard protocol during bolus administration of intravenous contrast. Multiplanar reconstructed images and MIPs were obtained and reviewed  to evaluate the vascular anatomy. CONTRAST:  64mL OMNIPAQUE IOHEXOL 350 MG/ML SOLN COMPARISON:  PETCT dated 10/29/2014 FINDINGS: CTA CHEST FINDINGS Emphysema. There is a small right pleural effusion with associated partial compressive  atelectasis of the right lower lobe. Superimposed pneumonia is not excluded. There is no pneumothorax. The central airways are patent. There is atherosclerotic calcification of the thoracic aorta. No CT evidence of pulmonary embolism. There is mild cardiomegaly with enlargement of the right cardiac chambers. Retrograde flow of contrast is noted from the right atrium into the IVC compatible with a degree of right cardiac dysfunction. Correlation with echocardiogram recommended. There is coronary vascular calcification. No pericardial effusion. Top-normal right paratracheal and right hilar lymph nodes. The visualized esophagus and the thyroid gland appears unremarkable. Small bilateral axillary lymph nodes. There is extensive subcutaneous soft tissue edema and anasarca. Osteopenia with degenerative changes of the spine. No acute fracture. Review of the MIP images confirms the above findings. CTA ABDOMEN AND PELVIS FINDINGS No intra-abdominal free air.  Small ascites. The liver appears unremarkable. There is moderate periportal edema. High attenuating content within the gallbladder may represent sludge versus small stones. There is diffuse thickening of the gallbladder wall. Ultrasound is recommended for better evaluation of the gallbladder. There is peripancreatic stranding concerning for pancreatitis. Correlation with pancreatic enzymes recommended. There is no drainable fluid collection/abscess or pseudocyst. The spleen, adrenal glands, kidneys, visualized ureters, and urinary bladder appear unremarkable. Subcentimeter bilateral renal hypodense lesions are too small to characterize. The uterus is irregular contour with multiple calcified lesions, likely fibroids. Constipation. Large stool ball noted within the rectum concerning for fecal impaction. There is sigmoid diverticulosis. A small amount of free fluid adjacent to the descending colon likely extension of ascitic fluid. No evidence of bowel obstruction. Normal  appendix. Small hiatal hernia. There is aortoiliac atherosclerotic disease. The origins of the celiac axis, SMA, IMA as well as the origins of the renal arteries are patent. There is an accessory left hepatic artery from the left gastric artery. No portal venous gas identified. There is no adenopathy. There is diffuse mesenteric stranding. Diffuse subcutaneous soft tissue edema and anasarca. Extensive degenerative changes of the spine. No acute fracture. There is extension of small amount of ascitic fluid the left inguinal canal. Review of the MIP images confirms the above findings. IMPRESSION: Small right pleural effusion with associated subsegmental compressive atelectasis of the lower lobes. Pneumonia is not excluded. No CT evidence of pulmonary embolism. Peripancreatic stranding and fluid concerning for acute pancreatitis. Correlation with pancreatic enzymes recommended. Gallbladder sludge versus small stones with apparent gallbladder wall thickening. Ultrasound is recommended for better evaluation of the gallbladder. Constipation with fecal impaction within the rectum. No bowel obstruction. Diverticulosis. Electronically Signed   By: Anner Crete M.D.   On: 01/22/2015 21:25   Nm Pulmonary Perf And Vent  02/04/2015  CLINICAL DATA:  80 year old female with history of pulmonary hypertension. Evaluate for potential chronic thromboembolic disease. EXAM: NUCLEAR MEDICINE VENTILATION - PERFUSION LUNG SCAN TECHNIQUE: Ventilation images were obtained in multiple projections using inhaled aerosol Tc-34m DTPA. Perfusion images were obtained in multiple projections after intravenous injection of Tc-43m MAA. RADIOPHARMACEUTICALS:  32.9 Technetium-6m DTPA aerosol inhalation and 3.4 Technetium-87m MAA IV COMPARISON:  No priors. FINDINGS: Ventilation: Severe multifocal ventilation defects throughout the lungs bilaterally, related to underlying bullous emphysema. Extensive deposition of radiotracer centrally in lungs.  Perfusion: Multiple matched defects in the lungs bilaterally, largest of which is in the region of the right middle lobe. No unmatched defects  identified. IMPRESSION: 1. Today's study is extremely challenging to interpret with respect to the presence or absence of chronic thromboembolic disease given the severity of bullous emphysema and the associated ventilation abnormalities. However, given the fact that only matched perfusion/ventilation defects are noted, and given that the recent chest CT demonstrated no central, lobar or segmental filling defects (assessed in retrospect), the likelihood of chronic thromboembolic disease in this patient is considered exceedingly low. Chronic pulmonary hypertension in this individual is far more likely related to their advanced bullous disease. 2. Today's study is considered very low probability (0-9%) for acute pulmonary embolism. Electronically Signed   By: Vinnie Langton M.D.   On: 02/04/2015 18:58   Dg Chest Port 1 View  02/04/2015  CLINICAL DATA:  Shortness of breath and wheezing today.  Ex-smoker. EXAM: PORTABLE CHEST 1 VIEW COMPARISON:  Previous examinations. FINDINGS: The cardiac silhouette remains borderline enlarged. Stable linear scarring in the left mid lung zone. Small amount of linear scarring in the right mid lung zone. Otherwise, clear lungs. Tortuous and partially calcified thoracic aorta. Unremarkable bones. IMPRESSION: No acute abnormality. Electronically Signed   By: Claudie Revering M.D.   On: 02/04/2015 15:57   Ct Cta Abd/pel W/cm &/or W/o Cm  01/19/2015  CLINICAL DATA:  80 year old female with shortness of breath, bradycardia, hypotension and epigastric pain EXAM: CT ANGIOGRAPHY CHEST, ABDOMEN AND PELVIS TECHNIQUE: Multidetector CT imaging through the chest, abdomen and pelvis was performed using the standard protocol during bolus administration of intravenous contrast. Multiplanar reconstructed images and MIPs were obtained and reviewed to evaluate  the vascular anatomy. CONTRAST:  54mL OMNIPAQUE IOHEXOL 350 MG/ML SOLN COMPARISON:  PETCT dated 10/29/2014 FINDINGS: CTA CHEST FINDINGS Emphysema. There is a small right pleural effusion with associated partial compressive atelectasis of the right lower lobe. Superimposed pneumonia is not excluded. There is no pneumothorax. The central airways are patent. There is atherosclerotic calcification of the thoracic aorta. No CT evidence of pulmonary embolism. There is mild cardiomegaly with enlargement of the right cardiac chambers. Retrograde flow of contrast is noted from the right atrium into the IVC compatible with a degree of right cardiac dysfunction. Correlation with echocardiogram recommended. There is coronary vascular calcification. No pericardial effusion. Top-normal right paratracheal and right hilar lymph nodes. The visualized esophagus and the thyroid gland appears unremarkable. Small bilateral axillary lymph nodes. There is extensive subcutaneous soft tissue edema and anasarca. Osteopenia with degenerative changes of the spine. No acute fracture. Review of the MIP images confirms the above findings. CTA ABDOMEN AND PELVIS FINDINGS No intra-abdominal free air.  Small ascites. The liver appears unremarkable. There is moderate periportal edema. High attenuating content within the gallbladder may represent sludge versus small stones. There is diffuse thickening of the gallbladder wall. Ultrasound is recommended for better evaluation of the gallbladder. There is peripancreatic stranding concerning for pancreatitis. Correlation with pancreatic enzymes recommended. There is no drainable fluid collection/abscess or pseudocyst. The spleen, adrenal glands, kidneys, visualized ureters, and urinary bladder appear unremarkable. Subcentimeter bilateral renal hypodense lesions are too small to characterize. The uterus is irregular contour with multiple calcified lesions, likely fibroids. Constipation. Large stool ball  noted within the rectum concerning for fecal impaction. There is sigmoid diverticulosis. A small amount of free fluid adjacent to the descending colon likely extension of ascitic fluid. No evidence of bowel obstruction. Normal appendix. Small hiatal hernia. There is aortoiliac atherosclerotic disease. The origins of the celiac axis, SMA, IMA as well as the origins of the renal arteries are patent.  There is an accessory left hepatic artery from the left gastric artery. No portal venous gas identified. There is no adenopathy. There is diffuse mesenteric stranding. Diffuse subcutaneous soft tissue edema and anasarca. Extensive degenerative changes of the spine. No acute fracture. There is extension of small amount of ascitic fluid the left inguinal canal. Review of the MIP images confirms the above findings. IMPRESSION: Small right pleural effusion with associated subsegmental compressive atelectasis of the lower lobes. Pneumonia is not excluded. No CT evidence of pulmonary embolism. Peripancreatic stranding and fluid concerning for acute pancreatitis. Correlation with pancreatic enzymes recommended. Gallbladder sludge versus small stones with apparent gallbladder wall thickening. Ultrasound is recommended for better evaluation of the gallbladder. Constipation with fecal impaction within the rectum. No bowel obstruction. Diverticulosis. Electronically Signed   By: Anner Crete M.D.   On: 02/01/2015 21:25     ASSESSMENT AND PLAN:   80 year old female with diabetes, severe pulmonary hypertension with chronic respiratory failure due to COPD on 2 L of oxygen he was discharged and the same day presented with syncope.  1. Sinus bradycardia: Patient has no evidence of complete heart block or type II heart block. Her heart rates have improved. Continue to hold Toprol and digoxin. 2-D echocardiogram most recently showed a normal ejection fraction with diastolic dysfunction and elevated pulmonary pressures.  Cardiology consultation is pending.  2. Hypotension: Approved with IV fluids. I suspect a part of her hypotension could be related to poor perfusion from her low heart rate.  3. Acute on chronic hypercapnic respiratory failure: This is secondary to COPD exacerbation. She is currently on BiPAP which will be weaned as tolerated. Patient has severe pulmonary hypertension on last echocardiogram. Patient has severe bullous disease on CT scan. Patient does follow with Dr. Raul Del. Continue IV steroids, nebulizers, inhalers. Plan to transition back to nasal cannula. Patient wears 2 L oxygen while sitting and 3 L on exertion. Go O2 is 90-92%.   4. Type 2 diabetes: Continue siding scale insulin and ADA diet.   5. Paroxysmal atrial fibrillation: Patient is now sinus bradycardia. Hold digoxin and metoprolol.  6. Depression: Continue Remeron 7. Non-ST elevation MI: Patient's troponin is elevated. Continue aspirin and statin. Cardiology is consulted. I will start heparin drip.   Management plans discussed with the patient and she is in agreement.  CODE STATUS: Full  Critical care TOTAL TIME TAKING CARE OF THIS PATIENT: 35 minutes.   High risk for cardiopulmonary arrest.  POSSIBLE D/C 2-3 days, DEPENDING ON CLINICAL CONDITION.   Dennys Traughber M.D on 2015/03/06 at 9:52 AM  Between 7am to 6pm - Pager - [phone] After 6pm go to [email or web address] - password [email or web address] [facility]  Office  [phone]  CC: Primary care physician; Loistine Chance, MD  Note: This dictation was prepared with Dragon dictation along with smaller phrase technology. Any transcriptional errors that result from this process are unintentional.

## 2015-03-09 NOTE — Progress Notes (Signed)
Report given to susan. Patient trop due at 12:00, orders entered for heparin drip and bolus. Awaiting for lab draws. Patient had bowel mvt and urinated. Tolerated diet. No complaints of pain.

## 2015-03-09 NOTE — Progress Notes (Signed)
Organ and tissue donation called, pt is not eligible due to her age, referral number: 03/05/2015-080. I spoke with Guerry Bruin. Conley Simmonds, RN

## 2015-03-09 NOTE — Progress Notes (Signed)
Patient was pronounced dead at 8:07 pm.

## 2015-03-09 NOTE — Progress Notes (Signed)
Pt unable to void via bedpan. Bladder scan showed 105 mL.

## 2015-03-09 NOTE — Consult Note (Signed)
ANTICOAGULATION CONSULT NOTE - Initial Consult  Pharmacy Consult for heparin drip Indication: chest pain/ACS  Allergies  Allergen Reactions  . Metformin Hcl Diarrhea  . Penicillins Hives    Patient Measurements: Height: 5' (152.4 cm) Weight: 107 lb 8 oz (48.762 kg) IBW/kg (Calculated) : 45.5 Heparin Dosing Weight: 48.8kg  Vital Signs: Temp: 98.4 F (36.9 C) (01/01 0800) Temp Source: Core (Comment) (01/01 0400) BP: 104/60 mmHg (01/01 0800) Pulse Rate: 69 (01/01 0800)  Labs:  Recent Labs  02/04/15 0717 02/02/2015 1546 2015-02-28 0020 02-28-15 0433 Feb 28, 2015 0602  HGB 10.8* 11.5*  --  11.1*  --   HCT 34.7* 38.5  --  36.7  --   PLT 267 348  --  249  --   CREATININE 0.90 0.99 1.21* 1.22*  --   TROPONINI 0.03 0.03 0.31*  --  1.57*    Estimated Creatinine Clearance: 27.7 mL/min (by C-G formula based on Cr of 1.22).   Medical History: Past Medical History  Diagnosis Date  . Type II or unspecified type diabetes mellitus without mention of complication, not stated as uncontrolled   . Chronic airway obstruction, not elsewhere classified     on 2L o2 at home  . Congestive heart failure, unspecified   . Pure hypercholesterolemia   . Essential hypertension, benign   . Gouty arthropathy   . Coronary atherosclerosis of unspecified type of vessel, native or graft 2011  . Arthritis   . Hernia 2011  . Hypertension 1966  . Personal history of tobacco use, presenting hazards to health   . Breast screening, unspecified   . Mammographic microcalcification 2013  . Special screening for malignant neoplasms, colon   . Bowel trouble 1968  . Bronchiolitis 2014  . Pulmonary hypertension (HCC)     Medications:  Scheduled:  . antiseptic oral rinse  7 mL Mouth Rinse BID  . aspirin EC  81 mg Oral Daily  . atorvastatin  80 mg Oral q1800  . [START ON 02/07/2015] heparin  2,900 Units Intravenous Once  . insulin aspart  0-5 Units Subcutaneous QHS  . insulin aspart  0-9 Units Subcutaneous  TID WC  . ipratropium-albuterol  3 mL Nebulization Q4H  . levofloxacin  250 mg Oral QPC supper  . methylPREDNISolone (SOLU-MEDROL) injection  40 mg Intravenous Daily  . mirtazapine  15 mg Oral QHS  . mometasone-formoterol  2 puff Inhalation BID  . pantoprazole  40 mg Oral Daily  . prednisoLONE acetate  1 drop Both Eyes QID  . sodium chloride  3 mL Intravenous Q12H    Assessment: Pt is a 80 year old lady with elevated troponin, NSTEMI. Pharmacy consulted to dose heparin drip. Pt received a therapeutic dose of lovenox at 0230 this AM. Due to renal function <40ml/min will need to wait 24 hours after therapeutic dose to start heparin drip. Goal of Therapy:  Heparin level 0.3-0.7 units/ml Monitor platelets by anticoagulation protocol: Yes   Plan:  Give 2900 units bolus x 1 Start heparin infusion at 600 units/hr Check anti-Xa level in 8 hours and daily while on heparin Continue to monitor H&H and platelets  Will draw baseline labs at 0100 on 1/2 and start drip at 0230 on 01/02.  Pharmacy to continue to monitor. Thank you for the consult.  Melissa D Maccia Feb 28, 2015,10:22 AM

## 2015-03-09 NOTE — Progress Notes (Signed)
Pt accepted in transfer from [facility] . She is a/o. and painfree. Vss. Heparin gtt initiated as ordered. Assisted to bsc. Gait steady. On 2l 02 and sats 93%. Tele sr in 60's.

## 2015-03-09 NOTE — Progress Notes (Signed)
Patient ID: Allison Francis, female   DOB: 02/27/1935, 80 y.o.   MRN: [id number]  Responded to [place].  The patient had bradycardia down and went asystole. CPR was in progress when I arrived. Atropine and epinephrine were pushed initially but the IV blue and not sure if it when in the vein or not. The patient was intubated by Dr. Edd Fabian ER physician Once the patient was intubated we did give epinephrine to 10 times down the ET tube. IO access was obtained by Dr. Edd Fabian ER physician Another 2 epinephrines were given along with 2 bicarbonates and 1 calcium. The patient received another atropine Dopamine drip was started  Total time of CPR was over 40 minutes. No meaningful survival will happen with this much CPR. The patient was still agonal breathing. Code was called 8:05 PM. No spontaneous heartbeat or pulses felt.  Dr. Loletha Grayer

## 2015-03-09 NOTE — Progress Notes (Signed)
Pt found to be bradycardic on tele at [place]. Breathing poorly and she was unresponsive. Chest compressions  Begun and Code blue called. resusitation continued for 35 minutes without regaining spontaneous pulse or blood pressure. Family summoned and code was called off.

## 2015-03-09 DEATH — deceased

## 2015-04-12 ENCOUNTER — Encounter: Payer: Self-pay | Admitting: Emergency Medicine
